# Patient Record
Sex: Female | Born: 1965
Health system: Southern US, Community
[De-identification: ages and names within clinical notes are randomized; demographics above are authoritative.]

## PROBLEM LIST (undated history)

## (undated) DIAGNOSIS — F419 Anxiety disorder, unspecified: Secondary | ICD-10-CM

## (undated) DIAGNOSIS — M26629 Arthralgia of temporomandibular joint, unspecified side: Secondary | ICD-10-CM

## (undated) DIAGNOSIS — N189 Chronic kidney disease, unspecified: Secondary | ICD-10-CM

## (undated) DIAGNOSIS — M797 Fibromyalgia: Secondary | ICD-10-CM

## (undated) DIAGNOSIS — M3500 Sicca syndrome, unspecified: Secondary | ICD-10-CM

## (undated) DIAGNOSIS — R29898 Other symptoms and signs involving the musculoskeletal system: Secondary | ICD-10-CM

## (undated) DIAGNOSIS — D759 Disease of blood and blood-forming organs, unspecified: Secondary | ICD-10-CM

## (undated) HISTORY — PX: TONSILLECTOMY: SUR1361

## (undated) HISTORY — PX: APPENDECTOMY: SHX54

## (undated) HISTORY — PX: BREAST SURGERY: SHX581

---
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
## 1998-08-10 ENCOUNTER — Encounter: Admission: RE | Admit: 1998-08-10 | Discharge: 1998-09-16 | Payer: Self-pay | Admitting: Family Medicine

## 1998-09-03 ENCOUNTER — Other Ambulatory Visit: Admission: RE | Admit: 1998-09-03 | Discharge: 1998-09-03 | Payer: Self-pay | Admitting: Family Medicine

## 2000-01-28 ENCOUNTER — Other Ambulatory Visit: Admission: RE | Admit: 2000-01-28 | Discharge: 2000-01-28 | Payer: Self-pay | Admitting: Family Medicine

## 2000-04-25 ENCOUNTER — Encounter: Payer: Self-pay | Admitting: Internal Medicine

## 2000-04-25 ENCOUNTER — Emergency Department (HOSPITAL_COMMUNITY): Admission: EM | Admit: 2000-04-25 | Discharge: 2000-04-25 | Payer: Self-pay | Admitting: Internal Medicine

## 2000-09-01 ENCOUNTER — Emergency Department (HOSPITAL_COMMUNITY): Admission: EM | Admit: 2000-09-01 | Discharge: 2000-09-01 | Payer: Self-pay | Admitting: Emergency Medicine

## 2000-09-01 ENCOUNTER — Encounter: Payer: Self-pay | Admitting: Emergency Medicine

## 2007-10-03 ENCOUNTER — Ambulatory Visit (HOSPITAL_BASED_OUTPATIENT_CLINIC_OR_DEPARTMENT_OTHER): Admission: RE | Admit: 2007-10-03 | Discharge: 2007-10-03 | Payer: Self-pay | Admitting: Family Medicine

## 2008-10-14 ENCOUNTER — Ambulatory Visit: Payer: Self-pay | Admitting: Internal Medicine

## 2008-10-14 ENCOUNTER — Inpatient Hospital Stay (HOSPITAL_COMMUNITY): Admission: EM | Admit: 2008-10-14 | Discharge: 2008-10-18 | Payer: Self-pay | Admitting: Emergency Medicine

## 2008-10-23 ENCOUNTER — Ambulatory Visit (HOSPITAL_BASED_OUTPATIENT_CLINIC_OR_DEPARTMENT_OTHER): Admission: RE | Admit: 2008-10-23 | Discharge: 2008-10-23 | Payer: Self-pay | Admitting: Family Medicine

## 2008-10-23 ENCOUNTER — Ambulatory Visit: Payer: Self-pay | Admitting: Diagnostic Radiology

## 2009-03-05 ENCOUNTER — Ambulatory Visit: Payer: Self-pay | Admitting: Diagnostic Radiology

## 2009-03-05 ENCOUNTER — Ambulatory Visit (HOSPITAL_BASED_OUTPATIENT_CLINIC_OR_DEPARTMENT_OTHER): Admission: RE | Admit: 2009-03-05 | Discharge: 2009-03-05 | Payer: Self-pay | Admitting: Family Medicine

## 2010-05-09 ENCOUNTER — Encounter: Payer: Self-pay | Admitting: Family Medicine

## 2010-07-25 LAB — CBC
HCT: 29.7 % — ABNORMAL LOW (ref 36.0–46.0)
HCT: 31.2 % — ABNORMAL LOW (ref 36.0–46.0)
HCT: 31.9 % — ABNORMAL LOW (ref 36.0–46.0)
Hemoglobin: 10.1 g/dL — ABNORMAL LOW (ref 12.0–15.0)
Hemoglobin: 10.4 g/dL — ABNORMAL LOW (ref 12.0–15.0)
Hemoglobin: 10.6 g/dL — ABNORMAL LOW (ref 12.0–15.0)
MCHC: 33.3 g/dL (ref 30.0–36.0)
MCHC: 33.4 g/dL (ref 30.0–36.0)
MCHC: 34.1 g/dL (ref 30.0–36.0)
MCV: 93.1 fL (ref 78.0–100.0)
MCV: 95.1 fL (ref 78.0–100.0)
MCV: 96 fL (ref 78.0–100.0)
Platelets: 243 10*3/uL (ref 150–400)
Platelets: 261 10*3/uL (ref 150–400)
Platelets: 283 10*3/uL (ref 150–400)
RBC: 3.19 MIL/uL — ABNORMAL LOW (ref 3.87–5.11)
RBC: 3.28 MIL/uL — ABNORMAL LOW (ref 3.87–5.11)
RBC: 3.32 MIL/uL — ABNORMAL LOW (ref 3.87–5.11)
RDW: 13.4 % (ref 11.5–15.5)
RDW: 13.6 % (ref 11.5–15.5)
RDW: 13.6 % (ref 11.5–15.5)
WBC: 12.9 10*3/uL — ABNORMAL HIGH (ref 4.0–10.5)
WBC: 16 10*3/uL — ABNORMAL HIGH (ref 4.0–10.5)
WBC: 31.2 10*3/uL — ABNORMAL HIGH (ref 4.0–10.5)

## 2010-07-25 LAB — BASIC METABOLIC PANEL
BUN: 11 mg/dL (ref 6–23)
BUN: 12 mg/dL (ref 6–23)
BUN: 8 mg/dL (ref 6–23)
CO2: 20 mEq/L (ref 19–32)
CO2: 21 mEq/L (ref 19–32)
CO2: 21 mEq/L (ref 19–32)
Calcium: 7.9 mg/dL — ABNORMAL LOW (ref 8.4–10.5)
Calcium: 8.1 mg/dL — ABNORMAL LOW (ref 8.4–10.5)
Calcium: 8.2 mg/dL — ABNORMAL LOW (ref 8.4–10.5)
Chloride: 109 mEq/L (ref 96–112)
Chloride: 112 mEq/L (ref 96–112)
Chloride: 113 mEq/L — ABNORMAL HIGH (ref 96–112)
Creatinine, Ser: 1.42 mg/dL — ABNORMAL HIGH (ref 0.4–1.2)
Creatinine, Ser: 1.53 mg/dL — ABNORMAL HIGH (ref 0.4–1.2)
Creatinine, Ser: 1.53 mg/dL — ABNORMAL HIGH (ref 0.4–1.2)
GFR calc Af Amer: 45 mL/min — ABNORMAL LOW (ref >60)
GFR calc Af Amer: 45 mL/min — ABNORMAL LOW (ref >60)
GFR calc Af Amer: 49 mL/min — ABNORMAL LOW (ref >60)
GFR calc non Af Amer: 37 mL/min — ABNORMAL LOW (ref >60)
GFR calc non Af Amer: 37 mL/min — ABNORMAL LOW (ref >60)
GFR calc non Af Amer: 40 mL/min — ABNORMAL LOW (ref >60)
Glucose, Bld: 121 mg/dL — ABNORMAL HIGH (ref 70–99)
Glucose, Bld: 132 mg/dL — ABNORMAL HIGH (ref 70–99)
Glucose, Bld: 92 mg/dL (ref 70–99)
Potassium: 3 mEq/L — ABNORMAL LOW (ref 3.5–5.1)
Potassium: 3.8 mEq/L (ref 3.5–5.1)
Potassium: 3.9 mEq/L (ref 3.5–5.1)
Sodium: 137 mEq/L (ref 135–145)
Sodium: 139 mEq/L (ref 135–145)
Sodium: 139 mEq/L (ref 135–145)

## 2010-07-26 LAB — CBC
HCT: 32.9 % — ABNORMAL LOW (ref 36.0–46.0)
HCT: 38.4 % (ref 36.0–46.0)
Hemoglobin: 10.9 g/dL — ABNORMAL LOW (ref 12.0–15.0)
Hemoglobin: 12.9 g/dL (ref 12.0–15.0)
MCHC: 33.2 g/dL (ref 30.0–36.0)
MCHC: 33.6 g/dL (ref 30.0–36.0)
MCV: 94.5 fL (ref 78.0–100.0)
MCV: 95.1 fL (ref 78.0–100.0)
Platelets: 262 10*3/uL (ref 150–400)
Platelets: 293 10*3/uL (ref 150–400)
RBC: 3.46 MIL/uL — ABNORMAL LOW (ref 3.87–5.11)
RBC: 4.07 MIL/uL (ref 3.87–5.11)
RDW: 13.6 % (ref 11.5–15.5)
RDW: 13.8 % (ref 11.5–15.5)
WBC: 26.8 10*3/uL — ABNORMAL HIGH (ref 4.0–10.5)
WBC: 36.5 10*3/uL — ABNORMAL HIGH (ref 4.0–10.5)

## 2010-07-26 LAB — BASIC METABOLIC PANEL
BUN: 13 mg/dL (ref 6–23)
BUN: 15 mg/dL (ref 6–23)
CO2: 23 mEq/L (ref 19–32)
CO2: 23 mEq/L (ref 19–32)
Calcium: 7.9 mg/dL — ABNORMAL LOW (ref 8.4–10.5)
Calcium: 8.9 mg/dL (ref 8.4–10.5)
Chloride: 107 mEq/L (ref 96–112)
Chloride: 111 mEq/L (ref 96–112)
Creatinine, Ser: 1.64 mg/dL — ABNORMAL HIGH (ref 0.4–1.2)
Creatinine, Ser: 1.7 mg/dL — ABNORMAL HIGH (ref 0.4–1.2)
GFR calc Af Amer: 40 mL/min — ABNORMAL LOW (ref >60)
GFR calc Af Amer: 41 mL/min — ABNORMAL LOW (ref >60)
GFR calc non Af Amer: 33 mL/min — ABNORMAL LOW (ref >60)
GFR calc non Af Amer: 34 mL/min — ABNORMAL LOW (ref >60)
Glucose, Bld: 101 mg/dL — ABNORMAL HIGH (ref 70–99)
Glucose, Bld: 125 mg/dL — ABNORMAL HIGH (ref 70–99)
Potassium: 3.7 mEq/L (ref 3.5–5.1)
Potassium: 4 mEq/L (ref 3.5–5.1)
Sodium: 136 mEq/L (ref 135–145)
Sodium: 138 mEq/L (ref 135–145)

## 2010-07-26 LAB — POCT PREGNANCY, URINE: Preg Test, Ur: NEGATIVE

## 2010-07-26 LAB — URINALYSIS, ROUTINE W REFLEX MICROSCOPIC
Bilirubin Urine: NEGATIVE
Glucose, UA: NEGATIVE mg/dL
Ketones, ur: NEGATIVE mg/dL
Nitrite: NEGATIVE
Protein, ur: 100 mg/dL — AB
Specific Gravity, Urine: 1.013 (ref 1.005–1.030)
Urobilinogen, UA: 0.2 mg/dL (ref 0.0–1.0)
pH: 7.5 (ref 5.0–8.0)

## 2010-07-26 LAB — DIFFERENTIAL
Basophils Absolute: 0 10*3/uL (ref 0.0–0.1)
Basophils Relative: 0 % (ref 0–1)
Eosinophils Absolute: 0.1 10*3/uL (ref 0.0–0.7)
Eosinophils Relative: 1 % (ref 0–5)
Lymphocytes Relative: 4 % — ABNORMAL LOW (ref 12–46)
Lymphs Abs: 1.2 10*3/uL (ref 0.7–4.0)
Monocytes Absolute: 2.9 10*3/uL — ABNORMAL HIGH (ref 0.1–1.0)
Monocytes Relative: 11 % (ref 3–12)
Neutro Abs: 22.6 10*3/uL — ABNORMAL HIGH (ref 1.7–7.7)
Neutrophils Relative %: 84 % — ABNORMAL HIGH (ref 43–77)

## 2010-07-26 LAB — URINE MICROSCOPIC-ADD ON

## 2010-07-26 LAB — URINE CULTURE: Colony Count: >=100000

## 2010-08-31 NOTE — Discharge Summary (Signed)
Katherine Contreras, Katherine Contreras                 ACCOUNT NO.:  [id number]   MEDICAL RECORD NO.:  [id number]          PATIENT TYPE:  INP   LOCATION:  [id number]                         FACILITY:  [facility]   PHYSICIAN:  Sheila Oats, M.D.DATE OF BIRTH:  05-19-1965   DATE OF ADMISSION:  10/14/2008  DATE OF DISCHARGE:  10/18/2008                               DISCHARGE SUMMARY   DISCHARGE DIAGNOSES:  1. Acute pyelonephritis, Escherichia coli.  2. Acute renal failure - improved with hydration, last creatinine on      discharge 1.42.  3. Hypokalemia.  4. Constipation - resolved.  5. History of idiopathic thrombocytopenic purpura and status post a      splenectomy in the past.  6. History of spinal meningitis at the age of 23.  59. History of kidney stones, with known right renal calculus.  8. History of fibromyalgia.  9. History of depression.   PROCEDURES AND STUDIES:  Renal ultrasound on October 15, 2008 - right renal  calculus, no hydronephrosis.   BRIEF HISTORY:  The patient is a 45 year old white female with the above-  listed medical problems, who presented with complaints of left flank  pain x4 days as well as left lower quadrant pain.  She reported that she  felt like she was constipated and treated herself with fiber drinks.  She subsequently had a bowel movement.  On the day of admission she  began having the flank pain and reported that it felt like when she had  a kidney stone previously.  She denied dysuria, also denied hematuria.  Admitted to urinary frequency in the 24 hours prior to admission.  She  was admitted for further evaluation and management.   Please see the full admission history and physical dictated on October 14, 2008, by Dr. Asa Lente for the details of the admission, physical exam,  as well as the laboratory data.   HOSPITAL COURSE:  1. Acute pyelonephritis - Upon admission the patient had a urinalysis      done, which showed wbc's too numerous to count and many bacteria   and also protein.  Her white cell count was elevated at 26.8.      Urine cultures were done and the patient was started empirically on      double-coverage antibiotics for pyelonephritis.  Her white cell      count initially went up to a maximum of 31.2 while in the hospital      and she developed some mild hypotension.  She was bolused with IV      fluids and maintained on the antibiotics.  Her hypotension resolved      and gradually her leukocytosis began improving.  Her left flank      pain has also improved gradually.  She has remained afebrile,      clinically improved.  Her last white cell count today prior to      discharge is 12.9 and she is tolerating p.o. well with no nausea or      vomiting.  She is also hemodynamically stable.  She will be  discharged on oral antibiotics to complete a 2-week course of      antibiotics for pyelonephritis.  She did have a renal ultrasound      done while in the hospital to evaluate for a possible abscess with      her white cell count increasing and the results as stated above,      renal calculus but otherwise normal.  2. Renal insufficiency - Her creatinine reached a peak of 1.7 in the      hospital.  With hydration this has improved, her last creatinine      today prior to discharge is 1.42.  The impression was that this was      secondary to #1 as well as the hypotension discussed above.  3. Constipation - The patient was receiving narcotic pain medications      in the hospital and is also noted to be on Vicodin at home p.r.n.      The impression was that this was the cause of her constipation.      With laxatives/enemas, this has resolved.  She is to continue the      MiraLax and stool softeners that she keeps at home.  4. History of fibromyalgia - She is to continue her outpatient      medications on discharge.  5. History of kidney stones/right renal calculus - As discussed above,      renal ultrasound revealed this finding and the  patient stated that      she was aware of this.  It is nonobstructive.  She is to follow up      with her primary care physician.  6. History of ITP, status post splenectomy - Her platelets remained      stable during her hospital stay, her last platelet count prior to      discharge is 283.  She is to follow up with her primary care      physician.  7. Hypokalemia - Her potassium was replaced in the hospital, and she      will be discharged on KCl x2 doses and is to follow up with her      primary care physician.   DISCHARGE MEDICATIONS:  1. KCl 20 mEq x2 doses.  2. Cipro 500 mg p.o. b.i.d. x10 more days.   Patient to continue:  1. Savella 200 mg daily.  2. Xanax 0.25 mg p.o. p.r.n. as previously.  3. Flexeril 10 mg q.8 h. p.r.n.  4. Vicodin q.4-6 h. p.r.n.  5. Ambien 10 mg q.h.s. p.r.n. as previously.   FOLLOWUP CARE:  Dr. Georga Bora in 1-2 weeks.   DISCHARGE CONDITION:  Improved/stable.      Sheila Oats, M.D.  Electronically Signed     ACV/MEDQ  D:  10/18/2008  T:  10/18/2008  Job:  [id number]   cc:   Herbie Baltimore L. Maceo Pro, M.D.  Fax: [phone]

## 2010-08-31 NOTE — H&P (Signed)
NAMEJOLAN, SUNDT                 ACCOUNT NO.:  [id number]   MEDICAL RECORD NO.:  [id number]          PATIENT TYPE:  EMS   LOCATION:  ED                           FACILITY[facility]  [facility]   PHYSICIAN:  Jannifer Rodney. Asa Lente, MDDATE OF BIRTH:  1966/04/03   DATE OF ADMISSION:  10/14/2008  DATE OF DISCHARGE:                              HISTORY & PHYSICAL   PRIMARY CARE PHYSICIAN:  Briscoe Deutscher, M.D. [facility])   CHIEF COMPLAINT:  Left flank pain.   HISTORY OF PRESENT ILLNESS:  The patient is a 45 year old white female  with history of kidney stones who presents to the [facility] emergency room today due to 4 days of left flank pain and left  lower quadrant pain.  Onset was Friday afternoon.  She says the pain  felt like constipation, so she treated herself with fiber drinks.  Positive bowel movement on Sunday which was normal and no bowel movement  since that time but had continued left lower quadrant and left flank  pain despite positive bowel movement.  Then she considered that the  symptoms felt today like prior kidney stone pain, so she came to the  emergency room for further evaluation.  She denies dysuria.  Denies  hematuria.  No fever, no chills.  No nausea or vomiting.  Positive  urinary frequency last 24 hours.   PAST MEDICAL HISTORY:  1. Depression.  2. Fibromyalgia.  3. Kidney stones, remote.  4. ITP history, status post splenectomy, remote.  5. Spinal meningitis at age of 61.  72. Status post appendectomy.  7. Status post tonsillectomy.   MEDICATIONS:  1. Savella 200 daily.  2. Xanax 0.25 mg q.4 h p.r.n. anxiety.  3. Flexeril 10 mg q.8 h p.r.n. spasm.  4. Vicodin p.r.n. question dose and frequency.  5. Ambien 10 mg q.h.s. p.r.n. insomnia.   ALLERGIES:  Include aspirin which she avoids due to her history of ITP  and no spleen.   FAMILY HISTORY:  Reviewed, noncontributory.   SOCIAL HISTORY:  She does not smoke.  Occasional alcohol.   REVIEW OF  SYSTEMS:  Other systems other systems are reviewed and are  negative. See HPI above.   PHYSICAL EXAMINATION:  VITAL SIGNS:  Temperature 98.2, blood pressure  125/86, pulse 100, respiratory rate 16, sating 100% on room air.  GENERAL:  She is a well-nourished, well-developed, age appropriate  appearing woman who is in acute distress, mildly uncomfortable due to  left lower quadrant pain but talking on cell without complications.  HEENT:  EYES:  Show no icterus or injection.  ENT: Oropharynx clear  without lesions or exudates.  NECK:  Supple.  No mass.  RESPIRATORY:  Shows clear to auscultation breath sounds bilaterally  without wheeze or crackle.  No increased work of breathing.  CARDIOVASCULAR:  Regular rate and rhythm.  No murmur.  EXTREMITIES:  No lower extremity edema bilaterally.  ABDOMEN:  Soft, nontender with good bowel sounds.  No rebound or  guarding.  No hepatomegaly or mass appreciated.  SKIN:  Shows no rash, bruising or petechia. Tattoo at the  small of her  back without inflammation.  MUSCULOSKELETAL:  Shows no joint deformities, tender in the left flank  CVA region to palpation.  NEURO:  Cranial nerves II-XII are symmetrically intact.  She follows  commands.  She moves all extremities and she has no dysarthria or  aphasia.  PSYCHIATRIC:  She is awake, alert and oriented x3 with a normal mood and  affect.   LABORATORY DATA:  Urinalysis with too numerous to count white blood  cells and many bacteria.  Also with 100 protein. Urine pregnancy test  negative.  CBC remarkable for white count of 26.8, normal hemoglobin and  platelets.  Basic metabolic remarkable for a creatinine of 1.6.   ASSESSMENT/PLAN:  1. Acute pyelonephritis.  This is evidenced by symptoms, urinalysis      and leukocytosis.  Also with mild renal insufficiency (elevated      creatinine), which may be new. We will continue two antibiotic      therapy with Cipro and Rocephin pending culture results.  Note       urine culture has been in ED but no blood cultures obtained and      will not draw now due to already have been given each antibiotics.      Will also check renal ultrasound in the morning to rule out      obstruction or abscess due to history of stones and plan to recheck      CBC in the a.m. Continue symptomatic treatment with p.r.n. pain      relief, IV Toradol and Dilaudid as well as Zofran as needed.  2. Mild acute renal insufficiency.  See problem #1 above.  Treat with      IV fluid and avoid renal toxic agents. Recheck B-met in a.m.  3. Fibromyalgia.  Continue home treatment.  4. History of ITP status post splenectomy. Normal platelets today.      Avoid aspirin.  Please see orders for further details.      Valerie A. Asa Lente, MD  Electronically Signed     VAL/MEDQ  D:  10/14/2008  T:  10/14/2008  Job:  [id number]

## 2011-12-28 ENCOUNTER — Other Ambulatory Visit: Payer: Self-pay | Admitting: Family Medicine

## 2011-12-28 DIAGNOSIS — R1310 Dysphagia, unspecified: Secondary | ICD-10-CM

## 2012-01-04 ENCOUNTER — Ambulatory Visit
Admission: RE | Admit: 2012-01-04 | Discharge: 2012-01-04 | Disposition: A | Discharge status: Home / Self Care | Payer: BC Managed Care – PPO | Source: Ambulatory Visit | Attending: Family Medicine | Admitting: Family Medicine

## 2012-01-04 DIAGNOSIS — R1310 Dysphagia, unspecified: Secondary | ICD-10-CM

## 2012-03-16 ENCOUNTER — Encounter (HOSPITAL_COMMUNITY): Payer: Self-pay | Admitting: *Deleted

## 2012-03-17 ENCOUNTER — Encounter (HOSPITAL_COMMUNITY): Payer: Self-pay | Admitting: Pharmacist

## 2012-03-19 ENCOUNTER — Other Ambulatory Visit: Payer: Self-pay | Admitting: Obstetrics and Gynecology

## 2012-03-22 ENCOUNTER — Encounter (HOSPITAL_COMMUNITY)
Admission: RE | Admit: 2012-03-22 | Discharge: 2012-03-22 | Disposition: A | Discharge status: Home / Self Care | Payer: BC Managed Care – PPO | Source: Ambulatory Visit | Attending: Obstetrics and Gynecology | Admitting: Obstetrics and Gynecology

## 2012-03-22 ENCOUNTER — Encounter (HOSPITAL_COMMUNITY): Payer: Self-pay

## 2012-03-22 HISTORY — DX: Disease of blood and blood-forming organs, unspecified: D75.9

## 2012-03-22 HISTORY — DX: Arthralgia of temporomandibular joint, unspecified side: M26.629

## 2012-03-22 HISTORY — DX: Sjogren syndrome, unspecified: M35.00

## 2012-03-22 LAB — CBC
HCT: 41.1 % (ref 36.0–46.0)
Hemoglobin: 13.6 g/dL (ref 12.0–15.0)
MCH: 30.8 pg (ref 26.0–34.0)
MCHC: 33.1 g/dL (ref 30.0–36.0)
MCV: 93.2 fL (ref 78.0–100.0)
Platelets: 344 10*3/uL (ref 150–400)
RBC: 4.41 MIL/uL (ref 3.87–5.11)
RDW: 13.9 % (ref 11.5–15.5)
WBC: 11.4 10*3/uL — ABNORMAL HIGH (ref 4.0–10.5)

## 2012-03-22 NOTE — Patient Instructions (Addendum)
Your procedure is scheduled on:03/28/12  Enter through the Main Entrance at :0830 am Pick up desk phone and dial [phone] and inform us of your arrival.  Please call [phone] if you have any problems the morning of surgery.  Remember: Do not eat or drink after midnight:Tuesday    DO NOT wear jewelry, eye make-up, lipstick,body lotion, or dark fingernail polish. Do not shave for 48 hours prior to surgery  Patients discharged on the day of surgery will not be allowed to drive home.

## 2012-03-28 ENCOUNTER — Encounter (HOSPITAL_COMMUNITY): Payer: Self-pay | Admitting: Anesthesiology

## 2012-03-28 ENCOUNTER — Encounter (HOSPITAL_COMMUNITY)
Admission: RE | Disposition: A | Discharge status: Home / Self Care | Payer: Self-pay | Source: Ambulatory Visit | Attending: Obstetrics and Gynecology

## 2012-03-28 ENCOUNTER — Ambulatory Visit (HOSPITAL_COMMUNITY): Payer: BC Managed Care – PPO | Admitting: Anesthesiology

## 2012-03-28 ENCOUNTER — Ambulatory Visit (HOSPITAL_COMMUNITY)
Admission: RE | Admit: 2012-03-28 | Discharge: 2012-03-28 | Disposition: A | Discharge status: Home / Self Care | Payer: BC Managed Care – PPO | Source: Ambulatory Visit | Attending: Obstetrics and Gynecology | Admitting: Obstetrics and Gynecology

## 2012-03-28 DIAGNOSIS — N840 Polyp of corpus uteri: Secondary | ICD-10-CM | POA: Insufficient documentation

## 2012-03-28 DIAGNOSIS — N938 Other specified abnormal uterine and vaginal bleeding: Secondary | ICD-10-CM | POA: Insufficient documentation

## 2012-03-28 DIAGNOSIS — N925 Other specified irregular menstruation: Secondary | ICD-10-CM | POA: Insufficient documentation

## 2012-03-28 DIAGNOSIS — Z01812 Encounter for preprocedural laboratory examination: Secondary | ICD-10-CM | POA: Insufficient documentation

## 2012-03-28 DIAGNOSIS — Z01818 Encounter for other preprocedural examination: Secondary | ICD-10-CM | POA: Insufficient documentation

## 2012-03-28 DIAGNOSIS — Z9889 Other specified postprocedural states: Secondary | ICD-10-CM

## 2012-03-28 DIAGNOSIS — N949 Unspecified condition associated with female genital organs and menstrual cycle: Secondary | ICD-10-CM | POA: Insufficient documentation

## 2012-03-28 HISTORY — PX: HYSTEROSCOPY WITH D & C: SHX1775

## 2012-03-28 HISTORY — DX: Fibromyalgia: M79.7

## 2012-03-28 HISTORY — DX: Anxiety disorder, unspecified: F41.9

## 2012-03-28 HISTORY — DX: Other symptoms and signs involving the musculoskeletal system: R29.898

## 2012-03-28 HISTORY — DX: Chronic kidney disease, unspecified: N18.9

## 2012-03-28 LAB — PREGNANCY, URINE: Preg Test, Ur: NEGATIVE

## 2012-03-28 SURGERY — DILATATION AND CURETTAGE /HYSTEROSCOPY
Anesthesia: General | Site: Vagina | Wound class: Clean Contaminated

## 2012-03-28 MED ORDER — LACTATED RINGERS IV SOLN
INTRAVENOUS | Status: DC
  Administered 2012-03-28 (×3): via INTRAVENOUS

## 2012-03-28 MED ORDER — LIDOCAINE HCL 2 % IJ SOLN
INTRAMUSCULAR | Status: AC
  Filled 2012-03-28: qty 20

## 2012-03-28 MED ORDER — PROMETHAZINE HCL 25 MG/ML IJ SOLN: 6.2500 mg | INTRAMUSCULAR | Status: DC | PRN

## 2012-03-28 MED ORDER — DEXAMETHASONE SODIUM PHOSPHATE 10 MG/ML IJ SOLN
INTRAMUSCULAR | Status: AC
  Filled 2012-03-28: qty 1

## 2012-03-28 MED ORDER — NAPROXEN 250 MG PO TABS: 250.0000 mg | ORAL_TABLET | Freq: Two times a day (BID) | ORAL | Status: DC

## 2012-03-28 MED ORDER — MIDAZOLAM HCL 2 MG/2ML IJ SOLN: 0.5000 mg | Freq: Once | INTRAMUSCULAR | Status: DC | PRN

## 2012-03-28 MED ORDER — LIDOCAINE HCL (CARDIAC) 20 MG/ML IV SOLN
INTRAVENOUS | Status: DC | PRN
  Administered 2012-03-28: 60 mg via INTRAVENOUS

## 2012-03-28 MED ORDER — FENTANYL CITRATE 0.05 MG/ML IJ SOLN
INTRAMUSCULAR | Status: DC | PRN
  Administered 2012-03-28 (×4): 50 ug via INTRAVENOUS

## 2012-03-28 MED ORDER — ONDANSETRON HCL 4 MG/2ML IJ SOLN
INTRAMUSCULAR | Status: DC | PRN
  Administered 2012-03-28: 4 mg via INTRAVENOUS

## 2012-03-28 MED ORDER — PROPOFOL 10 MG/ML IV EMUL
INTRAVENOUS | Status: DC | PRN
  Administered 2012-03-28: 150 mg via INTRAVENOUS

## 2012-03-28 MED ORDER — FENTANYL CITRATE 0.05 MG/ML IJ SOLN
INTRAMUSCULAR | Status: AC
  Administered 2012-03-28: 50 ug via INTRAVENOUS
  Filled 2012-03-28: qty 2

## 2012-03-28 MED ORDER — LIDOCAINE HCL (CARDIAC) 20 MG/ML IV SOLN
INTRAVENOUS | Status: AC
  Filled 2012-03-28: qty 5

## 2012-03-28 MED ORDER — ONDANSETRON HCL 4 MG/2ML IJ SOLN
INTRAMUSCULAR | Status: AC
  Filled 2012-03-28: qty 2

## 2012-03-28 MED ORDER — GLYCINE 1.5 % IR SOLN
Status: DC | PRN
  Administered 2012-03-28: 3000 mL

## 2012-03-28 MED ORDER — MIDAZOLAM HCL 2 MG/2ML IJ SOLN
INTRAMUSCULAR | Status: AC
  Filled 2012-03-28: qty 2

## 2012-03-28 MED ORDER — MEPERIDINE HCL 25 MG/ML IJ SOLN: 6.2500 mg | INTRAMUSCULAR | Status: DC | PRN

## 2012-03-28 MED ORDER — CEFAZOLIN SODIUM-DEXTROSE 2-3 GM-% IV SOLR
2.0000 g | INTRAVENOUS | Status: AC
  Administered 2012-03-28: 2 g via INTRAVENOUS

## 2012-03-28 MED ORDER — FENTANYL CITRATE 0.05 MG/ML IJ SOLN
25.0000 ug | INTRAMUSCULAR | Status: DC | PRN
  Administered 2012-03-28: 50 ug via INTRAVENOUS

## 2012-03-28 MED ORDER — FENTANYL CITRATE 0.05 MG/ML IJ SOLN
INTRAMUSCULAR | Status: AC
  Filled 2012-03-28: qty 4

## 2012-03-28 MED ORDER — LIDOCAINE HCL 2 % IJ SOLN
INTRAMUSCULAR | Status: DC | PRN
  Administered 2012-03-28: 20 mL

## 2012-03-28 MED ORDER — MIDAZOLAM HCL 5 MG/5ML IJ SOLN
INTRAMUSCULAR | Status: DC | PRN
  Administered 2012-03-28: 2 mg via INTRAVENOUS

## 2012-03-28 MED ORDER — PROPOFOL 10 MG/ML IV EMUL
INTRAVENOUS | Status: AC
  Filled 2012-03-28: qty 20

## 2012-03-28 MED ORDER — DEXAMETHASONE SODIUM PHOSPHATE 4 MG/ML IJ SOLN
INTRAMUSCULAR | Status: DC | PRN
  Administered 2012-03-28: 4 mg via INTRAVENOUS

## 2012-03-28 SURGICAL SUPPLY — 14 items
CANISTER SUCTION 2500CC (MISCELLANEOUS) ×2 IMPLANT
CATH ROBINSON RED A/P 16FR (CATHETERS) ×2 IMPLANT
CLOTH BEACON ORANGE TIMEOUT ST (SAFETY) ×2 IMPLANT
CONTAINER PREFILL 10% NBF 60ML (FORM) ×4 IMPLANT
DILATOR CANAL MILEX (MISCELLANEOUS) ×1 IMPLANT
DRESSING TELFA 8X3 (GAUZE/BANDAGES/DRESSINGS) ×2 IMPLANT
GLOVE BIO SURGEON STRL SZ7 (GLOVE) ×2 IMPLANT
GLOVE BIOGEL PI IND STRL 7.0 (GLOVE) ×2 IMPLANT
GLOVE BIOGEL PI INDICATOR 7.0 (GLOVE) ×2
GOWN STRL REIN XL XLG (GOWN DISPOSABLE) ×4 IMPLANT
PACK HYSTEROSCOPY LF (CUSTOM PROCEDURE TRAY) ×2 IMPLANT
PAD OB MATERNITY 4.3X12.25 (PERSONAL CARE ITEMS) ×2 IMPLANT
TOWEL OR 17X24 6PK STRL BLUE (TOWEL DISPOSABLE) ×4 IMPLANT
WATER STERILE IRR 1000ML POUR (IV SOLUTION) ×2 IMPLANT

## 2012-03-28 NOTE — Anesthesia Postprocedure Evaluation (Signed)
Anesthesia Post Note  Patient: Katherine Contreras  Procedure(s) Performed: Procedure(s) (LRB): DILATATION AND CURETTAGE /HYSTEROSCOPY (N/A)  Anesthesia type: GA  Patient location: PACU  Post pain: Pain level controlled  Post assessment: Post-op Vital signs reviewed  Last Vitals:  Filed Vitals:   03/28/12 1107  BP:   Pulse: 100  Temp:   Resp: 16    Post vital signs: Reviewed  Level of consciousness: sedated  Complications: No apparent anesthesia complications

## 2012-03-28 NOTE — H&P (Signed)
02/28/2012  History of Present Illness General:  46 y/o G1P1 referred for abnormal vaginal bleeding. She was noted to have a focal endometrial abnormality on ultrasound and a sonohystogram was previously ordered. Pt has a h/o regular menses x 28 days. Spotting for 30 days then had a period. Misses a month and then spots. New pattern started since the beginning of the year. Savella was prescribed ~ 1 year ago. Pt's normal menses were every 28 days, last 5 days. Three of those days were heavy, 4 pads/tampons used on heaviest days. Having hot flashes, night sweats and vaginal dryness. No pain with sex.  Went to infertility doctor 12 years ago and told her eggs were old. Given Provera and bled through it and stopped and spotting again. Did not seem to slow it down.  Current Medications  Amitiza 24 MCG Capsule 1 capsule with food Twice a day  Fluticasone Propionate 50 MCG/ACT Suspension 1 spray in each nostril once a day  Restasis 0.05 % Emulsion 1 into affected eye Twice daily  Savella 100 MG Tablet 1 tablet Twice daily  Tramadol HCl 50 MG Tablet 1 tablet three times a day as needed  Evoxac 30 MG Capsule 1 capsule as needed  Promethazine HCl 25mg  tab 1 tablet As needed  Cyclobenzaprine HCl 10 MG Tablet 1 tablet three times a day as needed  Alprazolam 0.5 MG Tablet 1/2 - 1 tablet Three times daily as needed  Hydrocodone-Acetaminophen 5-500 MG Tablet 1 tablet as needed for pain every 6 hrs  Ambien 10 MG Tablet 1/2-1 Once nightly as needed  Savella 50 mg 1 tab bid  Medication List reviewed and reconciled with the patient   Past Medical History  Migraine headaches  Fibromyalgia  Anxiety/panic disorder  Sjogren syndrome  TMJ  Hx of ITP  Aslpenic  Kidney stones  Allergic rhinitis  Stage 3 chronic kidney disease (Dr. Posey Pronto)   Surgical History  Splenectomy 1985  Appendectomy 1985  Tonsillectomy 1970   Family History  Father: deceased multiple myleoma, bone cancer   Mother: alive CAD,  anxiety, depression, HTN, arthritis, elevated lipids   Paternal [place] Father: deceased pancreatic cancer   Paternal Grand Mother: deceased Alzheimers   Maternal [place] Father: deceased   Maternal Grand Mother: deceased   Brother 1: alive healthy   Brother2: alive substance abuse   Sister 1: alive diabetes, kidney stones, 3 kidneys   2 brother(s) , 1 sister(s) .   no colon cancer, liver disease or polyps in history, denies any GYN family cancer hx.   Social History  General:  History of smoking cigarettes: Never smoked.  no Smoking.  Alcohol: yes, occasionally.  Caffeine: yes, tea.  no Recreational drug use, cocaine use in the past.  no Diet.  no Exercise.  Occupation: employed, [employer] in a [employer].  Education: yes, Some College.  Marital Status: single.  Children: 1, girls.  Religion: yes, [employer].    Gyn History  Sexual activity currently sexually active.  Periods : every month usually, the past year they have been irregular.  LMP 02/21/12.  Denies H/O Birth control .  Last pap smear date 12/28/11, neg.  Denies H/O Last mammogram date .  Denies H/O Abnormal pap smear .  Denies H/O STD .    OB History  Number of pregnancies 1.  Pregnancy # 1 live birth, vaginal delivery, girl.    Allergies  Aspir-81: ITP: Side Effects   Hospitalization/Major Diagnostic Procedure  abdominal pain 09/2008   Review of Systems  CONSTITUTIONAL:  Fatigue none. Fever none today.  SKIN:  Rash no. Suspicious lesions no.  CARDIOLOGY:  Chest pain none. Murmurs No h/o mitral valve prolapse.  RESPIRATORY:  Shortness of breath no. Cough no.  GASTROENTEROLOGY:  Abdominal pain none. Change in bowel habits no. Constipation No. Gallstones No gallbladder problems. Hepatitis/yellow jaundice No h/o liver problems.  FEMALE REPRODUCTIVE:  Breast lumps or discharge no. Breast pain none. Dyspareunia none. Dysuria no. Irregular menses no . Pelvic pain none. Regular menses yes. Unusual vaginal discharge yes,  vaginal spotting. Vaginal itching no.  NEUROLOGY:  Migraines yes. Seizures No. Tingling/numbness none.  PSYCHOLOGY:  Depression no.  ENDOCRINOLOGY:  Hot flashes yes. Weight gain none. Weight loss none.  HEMATOLOGY/LYMPH:  Anemia no. Blood Clots No h/o blood clots.  DERMATOLOGY:  Acne yes.  See HPI.  Vital Signs  Wt 114, Wt change -3 lb, Ht 65, BMI 18.97, BP sitting 148/90.    Physical Examination  GENERAL:          Patient appears  alert and oriented.          General Appearance:  well-appearing, well-developed, no acute distress, thin.          Speech:  clear.   FEMALE GENITOURINARY:          Cervix  visualized, healthy appearing, no discharge, no lesions.          Adnexa:  no mass, non tender.          Uterus:  normal size/shape/consistency, freely mobile, non tender.          Vagina:  pink/moist mucosa, no lesions, no abnormal discharge.          Vulva:  normal, no lesions, no skin discoloration.          Anus:  no external hemosrhoids.   EXTREMITIES:          general  no edema.           Assessments  1. DUB (dysfunctional uterine bleeding) - 626.8 (Primary)  2. Vaginal discharge - 623.5  3. Pregnancy test negative - V72.41    Treatment  1. DUB (dysfunctional uterine bleeding)   Start Provera Tablet, 10 MG, 1 tablet, Orally, Once daily x 10 days, 10 days, 10, Refills 1      LAB: [facility]     FSH 12.1 1.8-22.5 - MlU/ML               Shaquella Stamant B 02/28/2012 08:42:00 PM > FSH elevated by not perimenopausal by lab. Wet mount c/w BV, which is likely due to prolonged bleeding. Flagyl 500 mg BID x 7 days. #14 Ref: 0. Allman,Michelle 02/29/2012 10:33:12 AM > Pt infomred and rx sent.        LAB: Prolactin [id number])     Prolactin 22.1 4.8-23.3 - NG/ML               Allman,Michelle 02/29/2012 10:33:12 AM > Pt infomred and rx sent. Antwain Caliendo B 03/06/2012 09:33:56 AM > High normal. Repeat prolactin in a few months if DUB persists. Allman,Michelle 03/06/2012 09:41:17 AM > pt  informed.        LAB: [facility] Chlamydia/Gonorrhea Amplification [id number])  Negative     Chlamydia trachomatis, NAA NEGATIVE NEGATIVE -        Neisseria gonorrhoeae, NAA NEGATIVE NEGATIVE -        Please note: COMMENT -               Ozan Maclay B 03/06/2012 09:35:17 AM >  GC/chl neg Allman,Michelle 03/06/2012 09:41:17 AM > pt informed.   Procedure:GYN ENDOMETRIAL BIOPSY    Proceed with endometrial sampling in lieu of sonohystogram. Recommend proceeding with hysteroscopy/D&C if biopsy is normal for definitive management.      2. Vaginal discharge        LAB: [facility] CELLS Present -        TRICHOMONAS None Seen -        WBCS Normal -        YEAST None seen -               Azalyn Sliwa B 02/28/2012 08:42:00 PM > FSH elevated by not perimenopausal by lab. Wet mount c/w BV, which is likely due to prolonged bleeding. Flagyl 500 mg BID x 7 days. #14 Ref: 0. Allman,Michelle 02/29/2012 10:33:12 AM > Pt infomred and rx sent.     3. Pregnancy test negative        LAB: Pregnancy Test, Urine     Pregnancy Test, Urine negative               Allman,Michelle 02/28/2012 03:29:40 PM > Dr. Clayton Bibles informed.        Procedures  Venipuncture:          Venipuncture: Smith,Michele 02/28/2012 04:01:24 PM > , performed in right arm.   Endometrial Biopsy:          Consent Informed consent obtained.          Prep cervix was prepped with betadine.          Procedure uterus was sounded to7cm, a 4 mm pipet was advanced without difficulty, with good return of tissue.          Post procedure Patient tolerated procedure well.          Indication irregular bleeding .

## 2012-03-28 NOTE — Anesthesia Preprocedure Evaluation (Signed)
Anesthesia Evaluation  Patient identified by MRN, date of birth, ID band Patient awake    Reviewed: Allergy & Precautions, H&P , Patient's Chart, lab work & pertinent test results, reviewed documented beta blocker date and time   History of Anesthesia Complications Negative for: history of anesthetic complications  Airway Mallampati: II TM Distance: >3 FB Neck ROM: full    Dental No notable dental hx.    Pulmonary neg pulmonary ROS,  breath sounds clear to auscultation  Pulmonary exam normal       Cardiovascular Exercise Tolerance: Good negative cardio ROS  Rhythm:regular Rate:Normal     Neuro/Psych  Neuromuscular disease negative neurological ROS  negative psych ROS   GI/Hepatic negative GI ROS, Neg liver ROS,   Endo/Other  negative endocrine ROS  Renal/GU Renal diseasenegative Renal ROS     Musculoskeletal   Abdominal   Peds  Hematology negative hematology ROS (+) Blood dyscrasia, ,   Anesthesia Other Findings TMJ click     Chronic kidney disease   THIRD STAGE KIDNEY DISEASE - 30%    Anxiety     Fibromyalgia        Blood dyscrasia   remission of ITP Sjogren's syndrome        TMJ syndrome    Reproductive/Obstetrics negative OB ROS                           Anesthesia Physical Anesthesia Plan  ASA: III  Anesthesia Plan: General LMA   Post-op Pain Management:    Induction:   Airway Management Planned:   Additional Equipment:   Intra-op Plan:   Post-operative Plan:   Informed Consent: I have reviewed the patients History and Physical, chart, labs and discussed the procedure including the risks, benefits and alternatives for the proposed anesthesia with the patient or authorized representative who has indicated his/her understanding and acceptance.   Dental Advisory Given  Plan Discussed with: CRNA, Surgeon and Anesthesiologist  Anesthesia Plan Comments:          Anesthesia Quick Evaluation

## 2012-03-28 NOTE — Transfer of Care (Signed)
Immediate Anesthesia Transfer of Care Note  Patient: Katherine Contreras  Procedure(s) Performed: Procedure(s) (LRB) with comments: DILATATION AND CURETTAGE /HYSTEROSCOPY (N/A)  Patient Location: PACU  Anesthesia Type:General  Level of Consciousness: awake, alert , oriented and patient cooperative  Airway & Oxygen Therapy: Patient Spontanous Breathing and Patient connected to nasal cannula oxygen  Post-op Assessment: Report given to PACU RN and Post -op Vital signs reviewed and stable  Post vital signs: Reviewed and stable  Complications: No apparent anesthesia complications

## 2012-03-28 NOTE — Interval H&P Note (Signed)
History and Physical Interval Note:  03/28/2012 9:50 AM  Katherine Contreras  has presented today for surgery, with the diagnosis of DUB  The various methods of treatment have been discussed with the patient and family. After consideration of risks, benefits and other options for treatment, the patient has consented to  Procedure(s) (LRB) with comments: DILATATION AND CURETTAGE /HYSTEROSCOPY (N/A) as a surgical intervention .  The patient's history has been reviewed, patient examined, no change in status, stable for surgery.  I have reviewed the patient's chart and labs.  Questions were answered to the patient's satisfaction.    Pt reports she had a normal period 2 weeks ago.  Doing well.  Simona Huh, Kaelob Persky

## 2012-03-28 NOTE — Preoperative (Signed)
Beta Blockers   Reason not to administer Beta Blockers:Not Applicable 

## 2012-03-28 NOTE — Brief Op Note (Signed)
03/28/2012  12:42 PM  PATIENT:  Katherine Contreras  46 y.o. female  PRE-OPERATIVE DIAGNOSIS:  DUB  POST-OPERATIVE DIAGNOSIS:  dysfunctional uterine bleeding, endometrial mass  PROCEDURE:  Procedure(s) (LRB) with comments: DILATATION AND CURETTAGE /HYSTEROSCOPY (N/A)  SURGEON:  Surgeon(s) and Role:    * Thurnell Lose, MD - Primary  PHYSICIAN ASSISTANT: None  ASSISTANTS: Technician   ANESTHESIA:   general  EBL:  Total I/O In: 1700 [I.V.:1700] Out: 220 [Urine:200; Blood:20]  BLOOD ADMINISTERED:none  DRAINS: none   LOCAL MEDICATIONS USED:  2 %XYLOCAINE, 10 cc   SPECIMEN:  Source of Specimen:  Endometrial mass, endometrial currettings  DISPOSITION OF SPECIMEN:  PATHOLOGY  COUNTS:  YES  TOURNIQUET:  * No tourniquets in log *  DICTATION: .Other Dictation: Dictation Number [id number]  PLAN OF CARE: Discharge to home after PACU  PATIENT DISPOSITION:  PACU - hemodynamically stable.   Delay start of Pharmacological VTE agent (>24hrs) due to surgical blood loss or risk of bleeding: not applicable

## 2012-03-29 ENCOUNTER — Encounter (HOSPITAL_COMMUNITY): Payer: Self-pay | Admitting: Obstetrics and Gynecology

## 2012-03-29 NOTE — Op Note (Signed)
NAME:  JONNY, SYLVE NO.:  [id number]  MEDICAL RECORD NO.:  [id number]  LOCATION:  [facility]                          FACILITY:  [facility]  PHYSICIAN:  Jola Schmidt, MD   DATE OF BIRTH:  28-May-1965  DATE OF PROCEDURE:  03/28/2012 DATE OF DISCHARGE:  03/28/2012                              OPERATIVE REPORT   PREOPERATIVE DIAGNOSIS:  Dysfunctional uterine bleeding.  POSTOPERATIVE DIAGNOSES: 1. Dysfunctional uterine bleeding. 2. Endometrial mass.  SURGEON:  Raul Del. Simona Huh, MD  ASSISTANT:  Technician.  ANESTHESIA:  General.  Local 2% Xylocaine 10 mL.  ESTIMATED BLOOD LOSS:  20 mL.  URINE OUTPUT:  200 mL.  BLOOD ADMINISTERED:  None.  DRAINS:  None.  SPECIMEN:  Endometrial mass with biopsy and endometrial curettings.  DISPOSITION OF SPECIMEN:  To Pathology.  COUNTS:  Correct.  CONDITION:  To PACU, hemodynamically stable.  COMPLICATIONS:  None.  FINDINGS:  Small endometrial cavity, ostia seen bilaterally.  Midline endometrial mass, spongy in texture.  Base is at the anterior portion of the uterus and the posterior portion of the uterus.  No excrescences. Tissue was nonfriable.  No obvious signs of malignancy.  Stenotic cervical os.  DESCRIPTION OF PROCEDURE:  The patient was identified in the holding area.  She was then taken to the operating room where she underwent LMA anesthesia without complication.  She was then placed in the dorsal lithotomy position.  A bimanual exam was performed, revealing anteverted uterus and normal ovaries palpable bilaterally.  She was then prepped and draped in a normal sterile fashion.  Graves speculum was inserted into the uterus.  The cervical os was stenotic, so an os binder was used, and she was dilated.  She had a small curvature to her endocervical canal, and it was thought I was going to need to use different dilators, but I was able to dilate up to a 5 Hegar.  The hysteroscope was then advanced to the uterine  fundus which revealed the findings above.  Resection of the polyp was partially achieved with the polyp forceps and curettage.  The lesion appeared to be fixed to the uterine wall, so it did not have a thin stalk that I could just grab it and it was not free floating, so I did a direct biopsy under hysteroscopic visualization and that was sent to the pathologist separately.  The curettage was performed and all 4 quadrants tissue was obtained.  There was significant debulking of the polyps noted.  Prior to the procedure, a paracervical block with 10 mL of 2% Xylocaine was used.  The single-tooth tenaculum was used to grasp the anterior lip of the cervix.  The tenaculum was removed from the anterior lip of the cervix and hemostasis was achieved with tamponade.  There was no active bleeding from the cervical os upon removal of all instruments.  All instruments were removed from the vagina.  The patient tolerated the procedure well.  All instrument and sponge counts were correct x3.  She was awakened in the OR, and taken to the recovery room in stable condition.  She had Ancef 2 g IV prior to the procedure.  She had SCDs  on and operating during the entire case and a time-out was taken prior to starting the procedure.     Jola Schmidt, MD     EBV/MEDQ  D:  03/28/2012  T:  03/29/2012  Job:  [id number]

## 2013-07-31 ENCOUNTER — Other Ambulatory Visit (HOSPITAL_COMMUNITY): Payer: Self-pay | Admitting: Nephrology

## 2013-07-31 DIAGNOSIS — N183 Chronic kidney disease, stage 3 unspecified: Secondary | ICD-10-CM

## 2013-08-07 ENCOUNTER — Other Ambulatory Visit: Payer: Self-pay | Admitting: Radiology

## 2013-08-09 ENCOUNTER — Other Ambulatory Visit: Payer: Self-pay | Admitting: Radiology

## 2013-08-09 ENCOUNTER — Encounter (HOSPITAL_COMMUNITY): Payer: Self-pay | Admitting: Pharmacy Technician

## 2013-08-12 ENCOUNTER — Encounter (HOSPITAL_COMMUNITY): Payer: Self-pay

## 2013-08-12 ENCOUNTER — Ambulatory Visit (HOSPITAL_COMMUNITY)
Admission: RE | Admit: 2013-08-12 | Discharge: 2013-08-12 | Disposition: A | Discharge status: Home / Self Care | Payer: BC Managed Care – PPO | Source: Ambulatory Visit | Attending: Nephrology | Admitting: Nephrology

## 2013-08-12 DIAGNOSIS — M3500 Sicca syndrome, unspecified: Secondary | ICD-10-CM | POA: Insufficient documentation

## 2013-08-12 DIAGNOSIS — N183 Chronic kidney disease, stage 3 unspecified: Secondary | ICD-10-CM | POA: Insufficient documentation

## 2013-08-12 DIAGNOSIS — IMO0001 Reserved for inherently not codable concepts without codable children: Secondary | ICD-10-CM | POA: Insufficient documentation

## 2013-08-12 LAB — CBC
HCT: 39.1 % (ref 36.0–46.0)
Hemoglobin: 13 g/dL (ref 12.0–15.0)
MCH: 30.2 pg (ref 26.0–34.0)
MCHC: 33.2 g/dL (ref 30.0–36.0)
MCV: 90.7 fL (ref 78.0–100.0)
Platelets: 334 10*3/uL (ref 150–400)
RBC: 4.31 MIL/uL (ref 3.87–5.11)
RDW: 13.3 % (ref 11.5–15.5)
WBC: 9.2 10*3/uL (ref 4.0–10.5)

## 2013-08-12 LAB — PROTIME-INR
INR: 1.07 (ref 0.00–1.49)
Prothrombin Time: 13.7 seconds (ref 11.6–15.2)

## 2013-08-12 LAB — APTT: aPTT: 31 seconds (ref 24–37)

## 2013-08-12 LAB — PREGNANCY, URINE: Preg Test, Ur: NEGATIVE

## 2013-08-12 MED ORDER — MIDAZOLAM HCL 2 MG/2ML IJ SOLN
INTRAMUSCULAR | Status: AC | PRN
  Administered 2013-08-12 (×2): 2 mg via INTRAVENOUS

## 2013-08-12 MED ORDER — FENTANYL CITRATE 0.05 MG/ML IJ SOLN
INTRAMUSCULAR | Status: AC
  Filled 2013-08-12: qty 4

## 2013-08-12 MED ORDER — SODIUM CHLORIDE 0.9 % IV SOLN: Freq: Once | INTRAVENOUS | Status: DC

## 2013-08-12 MED ORDER — MIDAZOLAM HCL 2 MG/2ML IJ SOLN
INTRAMUSCULAR | Status: AC
  Filled 2013-08-12: qty 4

## 2013-08-12 MED ORDER — FENTANYL CITRATE 0.05 MG/ML IJ SOLN
INTRAMUSCULAR | Status: AC | PRN
Start: 2013-08-12 — End: 2013-08-12
  Administered 2013-08-12 (×2): 50 ug via INTRAVENOUS

## 2013-08-12 NOTE — Procedures (Signed)
Procedure:  Ultrasound guided core biopsy of right kidney Findings:  16 G core biopsy x 2 of LP cortex of right kidney.  No immediate complications. 

## 2013-08-12 NOTE — H&P (Signed)
Katherine Contreras is an 48 y.o. female.   Chief Complaint: Pt with Chronic Kidney Disease followed by Dr Posey Pronto over last 2 yrs. Worsening renal function over last yr. Now scheduled for renal core biopsy Pt with hx Sjogrens syndrome  HPI: CKD lll; fibromyalgia; Sjogrens; TMJ; ITP hx   Past Medical History  Diagnosis Date  . TMJ click   . Chronic kidney disease     THIRD STAGE KIDNEY DISEASE - 30%  . Anxiety   . Fibromyalgia   . Blood dyscrasia     remission of ITP  . Sjogren's syndrome   . TMJ syndrome     Past Surgical History  Procedure Laterality Date  . Appendectomy    . Tonsillectomy    . Spleenectomy    . Breast surgery      AUGMENTATION  . Hysteroscopy w/d&c  03/28/2012    Procedure: DILATATION AND CURETTAGE /HYSTEROSCOPY;  Surgeon: Thurnell Lose, MD;  Location: [facility];  Service: Gynecology;  Laterality: N/A;    No family history on file. Social History:  reports that she has never smoked. She does not have any smokeless tobacco history on file. She reports that she drinks about .6 ounces of alcohol per week. She reports that she does not use illicit drugs.  Allergies:  Allergies  Allergen Reactions  . Aspirin Other (See Comments)    SPLEEN REMOVED AND HISTORY OF PLATELET PROBLEM IN THE PAST Pt takes alleve if needed     (Not in a hospital admission)  Results for orders placed during the hospital encounter of 08/12/13 (from the past 48 hour(s))  CBC     Status: None   Collection Time    08/12/13  8:53 AM      Result Value Ref Range   WBC 9.2  4.0 - 10.5 K/uL   RBC 4.31  3.87 - 5.11 MIL/uL   Hemoglobin 13.0  12.0 - 15.0 g/dL   HCT 39.1  36.0 - 46.0 %   MCV 90.7  78.0 - 100.0 fL   MCH 30.2  26.0 - 34.0 pg   MCHC 33.2  30.0 - 36.0 g/dL   RDW 13.3  11.5 - 15.5 %   Platelets 334  150 - 400 K/uL   No results found.  Review of Systems  Constitutional: Negative for fever, chills and weight loss.  Respiratory: Negative for cough and sputum production.    Cardiovascular: Negative for chest pain.  Gastrointestinal: Negative for nausea, vomiting and abdominal pain.  Genitourinary: Negative for hematuria.  Musculoskeletal: Negative for back pain.  Neurological: Negative for weakness and headaches.  Psychiatric/Behavioral: Negative for substance abuse. The patient is nervous/anxious.     Blood pressure 113/90, pulse 93, temperature 97.8 F (36.6 C), temperature source Oral, resp. rate 20, height 5\' 4"  (1.626 m), weight 54.432 kg (120 lb), SpO2 99.00%. Physical Exam  Constitutional: She is oriented to person, place, and time. She appears well-developed and well-nourished.  Cardiovascular: Normal rate and regular rhythm.   No murmur heard. Respiratory: Effort normal and breath sounds normal. She has no wheezes.  GI: Soft. Bowel sounds are normal. There is no tenderness.  Musculoskeletal: Normal range of motion.  Neurological: She is alert and oriented to person, place, and time.  Skin: Skin is warm and dry.  Psychiatric: She has a normal mood and affect. Her behavior is normal. Judgment and thought content normal.     Assessment/Plan CKDlll Declining function especially this last yr Renal biopsy requested by Dr Posey Pronto  Pt aware of procedure beneifts and risks and agreeable to proceed Consent signed and in chart   Lavonia Drafts 08/12/2013, 9:35 AM

## 2013-08-12 NOTE — Discharge Instructions (Signed)
Kidney Biopsy, Care After °Refer to this sheet in the next few weeks. These instructions provide you with information on caring for yourself after your procedure. Your health care provider may also give you more specific instructions. Your treatment has been planned according to current medical practices, but problems sometimes occur. Call your health care provider if you have any problems or questions after your procedure.  °WHAT TO EXPECT AFTER THE PROCEDURE  °· You may notice blood in the urine for the first 24 hours after the biopsy. °· You may feel some pain at the biopsy site for 1 2 weeks after the biopsy. °HOME CARE INSTRUCTIONS °· Do not lift anything heavier than 10 lb (4.5 kg) for 2 weeks. °· Do not take any non-steroidal anti-inflammatory drugs (NSAIDs) or any blood thinners for a week after the biopsy unless instructed to do so by your health care provider. °· Only take medicines for pain, fever, or discomfort as directed by your health care provider. °SEEK MEDICAL CARE IF: °· You have bloody urine more than 24 hours after the biopsy.   °· You develop a fever.   °· You cannot urinate.   °· You have increasing pain at the biopsy site.   °SEEK IMMEDIATE MEDICAL CARE IF: °You feel faint or dizzy.  °Document Released: 12/05/2012 Document Reviewed: 12/05/2012 °ExitCare® Patient Information ©2014 [employer]. ° °

## 2013-08-12 NOTE — H&P (Signed)
Agree.  For random core biopsy of kidney today.

## 2013-08-27 ENCOUNTER — Other Ambulatory Visit: Payer: Self-pay

## 2013-08-29 ENCOUNTER — Encounter (HOSPITAL_COMMUNITY): Payer: Self-pay

## 2014-10-02 ENCOUNTER — Other Ambulatory Visit: Payer: Self-pay | Admitting: Physician Assistant

## 2014-10-02 ENCOUNTER — Other Ambulatory Visit: Payer: Self-pay | Admitting: Family Medicine

## 2014-10-02 DIAGNOSIS — M5416 Radiculopathy, lumbar region: Secondary | ICD-10-CM

## 2014-10-06 ENCOUNTER — Ambulatory Visit (INDEPENDENT_AMBULATORY_CARE_PROVIDER_SITE_OTHER): Payer: BLUE CROSS/BLUE SHIELD

## 2014-10-06 DIAGNOSIS — M5416 Radiculopathy, lumbar region: Secondary | ICD-10-CM

## 2014-10-06 DIAGNOSIS — M5127 Other intervertebral disc displacement, lumbosacral region: Secondary | ICD-10-CM | POA: Diagnosis not present

## 2014-10-08 ENCOUNTER — Other Ambulatory Visit: Payer: Self-pay | Admitting: Family Medicine

## 2014-10-08 DIAGNOSIS — N2889 Other specified disorders of kidney and ureter: Secondary | ICD-10-CM

## 2014-10-09 ENCOUNTER — Ambulatory Visit (INDEPENDENT_AMBULATORY_CARE_PROVIDER_SITE_OTHER): Payer: BLUE CROSS/BLUE SHIELD

## 2014-10-09 DIAGNOSIS — N2889 Other specified disorders of kidney and ureter: Secondary | ICD-10-CM

## 2014-12-31 ENCOUNTER — Other Ambulatory Visit: Payer: Self-pay | Admitting: Specialist

## 2014-12-31 DIAGNOSIS — M5416 Radiculopathy, lumbar region: Secondary | ICD-10-CM

## 2015-01-15 ENCOUNTER — Ambulatory Visit
Admission: RE | Admit: 2015-01-15 | Discharge: 2015-01-15 | Disposition: A | Discharge status: Home / Self Care | Payer: BLUE CROSS/BLUE SHIELD | Source: Ambulatory Visit | Attending: Specialist | Admitting: Specialist

## 2015-01-15 DIAGNOSIS — M5416 Radiculopathy, lumbar region: Secondary | ICD-10-CM

## 2015-07-20 ENCOUNTER — Other Ambulatory Visit: Payer: Self-pay | Admitting: Specialist

## 2015-07-20 DIAGNOSIS — M5432 Sciatica, left side: Secondary | ICD-10-CM

## 2015-07-28 ENCOUNTER — Ambulatory Visit
Admission: RE | Admit: 2015-07-28 | Discharge: 2015-07-28 | Disposition: A | Discharge status: Home / Self Care | Payer: BLUE CROSS/BLUE SHIELD | Source: Ambulatory Visit | Attending: Specialist | Admitting: Specialist

## 2015-07-28 DIAGNOSIS — M5432 Sciatica, left side: Secondary | ICD-10-CM

## 2015-07-28 DIAGNOSIS — M7122 Synovial cyst of popliteal space [Baker], left knee: Secondary | ICD-10-CM | POA: Diagnosis not present

## 2015-07-28 MED ORDER — METHYLPREDNISOLONE ACETATE 40 MG/ML INJ SUSP (RADIOLOG: 120.0000 mg | Freq: Once | INTRAMUSCULAR | Status: DC

## 2015-08-07 DIAGNOSIS — M5432 Sciatica, left side: Secondary | ICD-10-CM | POA: Diagnosis not present

## 2015-08-07 DIAGNOSIS — M5136 Other intervertebral disc degeneration, lumbar region: Secondary | ICD-10-CM | POA: Diagnosis not present

## 2015-11-10 DIAGNOSIS — R350 Frequency of micturition: Secondary | ICD-10-CM | POA: Diagnosis not present

## 2015-11-10 DIAGNOSIS — N200 Calculus of kidney: Secondary | ICD-10-CM | POA: Diagnosis not present

## 2015-11-11 DIAGNOSIS — N201 Calculus of ureter: Secondary | ICD-10-CM | POA: Diagnosis not present

## 2015-11-12 ENCOUNTER — Other Ambulatory Visit (HOSPITAL_BASED_OUTPATIENT_CLINIC_OR_DEPARTMENT_OTHER): Payer: Self-pay | Admitting: Family Medicine

## 2015-11-12 ENCOUNTER — Ambulatory Visit (HOSPITAL_BASED_OUTPATIENT_CLINIC_OR_DEPARTMENT_OTHER)
Admission: RE | Admit: 2015-11-12 | Discharge: 2015-11-12 | Disposition: A | Discharge status: Home / Self Care | Payer: BLUE CROSS/BLUE SHIELD | Source: Ambulatory Visit | Attending: Family Medicine | Admitting: Family Medicine

## 2015-11-12 DIAGNOSIS — R109 Unspecified abdominal pain: Secondary | ICD-10-CM | POA: Insufficient documentation

## 2015-11-12 DIAGNOSIS — Z87442 Personal history of urinary calculi: Secondary | ICD-10-CM

## 2015-11-12 DIAGNOSIS — N200 Calculus of kidney: Secondary | ICD-10-CM | POA: Diagnosis not present

## 2015-12-31 DIAGNOSIS — D649 Anemia, unspecified: Secondary | ICD-10-CM | POA: Diagnosis not present

## 2015-12-31 DIAGNOSIS — Z0000 Encounter for general adult medical examination without abnormal findings: Secondary | ICD-10-CM | POA: Diagnosis not present

## 2015-12-31 DIAGNOSIS — R55 Syncope and collapse: Secondary | ICD-10-CM | POA: Diagnosis not present

## 2015-12-31 DIAGNOSIS — M797 Fibromyalgia: Secondary | ICD-10-CM | POA: Diagnosis not present

## 2015-12-31 DIAGNOSIS — N951 Menopausal and female climacteric states: Secondary | ICD-10-CM | POA: Diagnosis not present

## 2015-12-31 DIAGNOSIS — Z23 Encounter for immunization: Secondary | ICD-10-CM | POA: Diagnosis not present

## 2015-12-31 DIAGNOSIS — G4701 Insomnia due to medical condition: Secondary | ICD-10-CM | POA: Diagnosis not present

## 2016-01-13 DIAGNOSIS — N184 Chronic kidney disease, stage 4 (severe): Secondary | ICD-10-CM | POA: Diagnosis not present

## 2016-01-20 DIAGNOSIS — I10 Essential (primary) hypertension: Secondary | ICD-10-CM | POA: Diagnosis not present

## 2016-01-20 DIAGNOSIS — N184 Chronic kidney disease, stage 4 (severe): Secondary | ICD-10-CM | POA: Diagnosis not present

## 2016-02-10 DIAGNOSIS — K648 Other hemorrhoids: Secondary | ICD-10-CM | POA: Diagnosis not present

## 2016-02-10 DIAGNOSIS — Z86010 Personal history of colonic polyps: Secondary | ICD-10-CM | POA: Diagnosis not present

## 2016-02-17 DIAGNOSIS — N184 Chronic kidney disease, stage 4 (severe): Secondary | ICD-10-CM | POA: Diagnosis not present

## 2016-02-17 DIAGNOSIS — Z8739 Personal history of other diseases of the musculoskeletal system and connective tissue: Secondary | ICD-10-CM | POA: Diagnosis not present

## 2016-02-17 DIAGNOSIS — R55 Syncope and collapse: Secondary | ICD-10-CM | POA: Diagnosis not present

## 2016-02-29 DIAGNOSIS — R55 Syncope and collapse: Secondary | ICD-10-CM | POA: Diagnosis not present

## 2016-03-08 DIAGNOSIS — R55 Syncope and collapse: Secondary | ICD-10-CM | POA: Diagnosis not present

## 2016-03-08 DIAGNOSIS — Z8739 Personal history of other diseases of the musculoskeletal system and connective tissue: Secondary | ICD-10-CM | POA: Diagnosis not present

## 2016-03-15 DIAGNOSIS — N186 End stage renal disease: Secondary | ICD-10-CM | POA: Diagnosis not present

## 2016-03-15 DIAGNOSIS — Z01818 Encounter for other preprocedural examination: Secondary | ICD-10-CM | POA: Diagnosis not present

## 2016-03-15 DIAGNOSIS — I120 Hypertensive chronic kidney disease with stage 5 chronic kidney disease or end stage renal disease: Secondary | ICD-10-CM | POA: Diagnosis not present

## 2016-03-15 DIAGNOSIS — Z992 Dependence on renal dialysis: Secondary | ICD-10-CM | POA: Diagnosis not present

## 2016-03-22 DIAGNOSIS — N6310 Unspecified lump in the right breast, unspecified quadrant: Secondary | ICD-10-CM | POA: Diagnosis not present

## 2016-04-20 DIAGNOSIS — J029 Acute pharyngitis, unspecified: Secondary | ICD-10-CM | POA: Diagnosis not present

## 2016-04-27 DIAGNOSIS — N184 Chronic kidney disease, stage 4 (severe): Secondary | ICD-10-CM | POA: Diagnosis not present

## 2016-05-03 DIAGNOSIS — I10 Essential (primary) hypertension: Secondary | ICD-10-CM | POA: Diagnosis not present

## 2016-05-03 DIAGNOSIS — N184 Chronic kidney disease, stage 4 (severe): Secondary | ICD-10-CM | POA: Diagnosis not present

## 2016-05-16 DIAGNOSIS — N184 Chronic kidney disease, stage 4 (severe): Secondary | ICD-10-CM | POA: Diagnosis not present

## 2016-05-27 ENCOUNTER — Encounter (HOSPITAL_BASED_OUTPATIENT_CLINIC_OR_DEPARTMENT_OTHER): Payer: Self-pay | Admitting: *Deleted

## 2016-05-27 ENCOUNTER — Emergency Department (HOSPITAL_BASED_OUTPATIENT_CLINIC_OR_DEPARTMENT_OTHER): Payer: BLUE CROSS/BLUE SHIELD

## 2016-05-27 ENCOUNTER — Emergency Department (HOSPITAL_BASED_OUTPATIENT_CLINIC_OR_DEPARTMENT_OTHER)
Admission: EM | Admit: 2016-05-27 | Discharge: 2016-05-27 | Disposition: A | Discharge status: Home / Self Care | Payer: BLUE CROSS/BLUE SHIELD | Attending: Emergency Medicine | Admitting: Emergency Medicine

## 2016-05-27 DIAGNOSIS — R55 Syncope and collapse: Secondary | ICD-10-CM | POA: Diagnosis not present

## 2016-05-27 DIAGNOSIS — R079 Chest pain, unspecified: Secondary | ICD-10-CM | POA: Diagnosis not present

## 2016-05-27 DIAGNOSIS — N183 Chronic kidney disease, stage 3 (moderate): Secondary | ICD-10-CM | POA: Diagnosis not present

## 2016-05-27 DIAGNOSIS — R51 Headache: Secondary | ICD-10-CM | POA: Diagnosis not present

## 2016-05-27 DIAGNOSIS — M79602 Pain in left arm: Secondary | ICD-10-CM

## 2016-05-27 DIAGNOSIS — Z79899 Other long term (current) drug therapy: Secondary | ICD-10-CM | POA: Diagnosis not present

## 2016-05-27 LAB — DIFFERENTIAL
Basophils Absolute: 0.1 10*3/uL (ref 0.0–0.1)
Basophils Relative: 1 %
Eosinophils Absolute: 0.6 10*3/uL (ref 0.0–0.7)
Eosinophils Relative: 5 %
Lymphocytes Relative: 27 %
Lymphs Abs: 3.2 10*3/uL (ref 0.7–4.0)
Monocytes Absolute: 1.5 10*3/uL — ABNORMAL HIGH (ref 0.1–1.0)
Monocytes Relative: 13 %
Neutro Abs: 6.3 10*3/uL (ref 1.7–7.7)
Neutrophils Relative %: 54 %

## 2016-05-27 LAB — CBC
HCT: 33.5 % — ABNORMAL LOW (ref 36.0–46.0)
Hemoglobin: 10.8 g/dL — ABNORMAL LOW (ref 12.0–15.0)
MCH: 30.3 pg (ref 26.0–34.0)
MCHC: 32.2 g/dL (ref 30.0–36.0)
MCV: 93.8 fL (ref 78.0–100.0)
Platelets: 338 10*3/uL (ref 150–400)
RBC: 3.57 MIL/uL — ABNORMAL LOW (ref 3.87–5.11)
RDW: 13.5 % (ref 11.5–15.5)
WBC: 11.7 10*3/uL — ABNORMAL HIGH (ref 4.0–10.5)

## 2016-05-27 LAB — TROPONIN I
Troponin I: <0.03 ng/mL (ref <0.03)
Troponin I: <0.03 ng/mL (ref <0.03)

## 2016-05-27 LAB — COMPREHENSIVE METABOLIC PANEL
ALT: 29 U/L (ref 14–54)
AST: 30 U/L (ref 15–41)
Albumin: 3.9 g/dL (ref 3.5–5.0)
Alkaline Phosphatase: 100 U/L (ref 38–126)
Anion gap: 6 (ref 5–15)
BUN: 24 mg/dL — ABNORMAL HIGH (ref 6–20)
CO2: 23 mmol/L (ref 22–32)
Calcium: 8.4 mg/dL — ABNORMAL LOW (ref 8.9–10.3)
Chloride: 109 mmol/L (ref 101–111)
Creatinine, Ser: 2.36 mg/dL — ABNORMAL HIGH (ref 0.44–1.00)
GFR calc Af Amer: 26 mL/min — ABNORMAL LOW (ref >60)
GFR calc non Af Amer: 23 mL/min — ABNORMAL LOW (ref >60)
Glucose, Bld: 155 mg/dL — ABNORMAL HIGH (ref 65–99)
Potassium: 3.3 mmol/L — ABNORMAL LOW (ref 3.5–5.1)
Sodium: 138 mmol/L (ref 135–145)
Total Bilirubin: 0.5 mg/dL (ref 0.3–1.2)
Total Protein: 7.7 g/dL (ref 6.5–8.1)

## 2016-05-27 LAB — PROTIME-INR
INR: 1
Prothrombin Time: 13.2 seconds (ref 11.4–15.2)

## 2016-05-27 LAB — APTT: aPTT: 32 seconds (ref 24–36)

## 2016-05-27 NOTE — ED Notes (Signed)
Patient transported to CT 

## 2016-05-27 NOTE — ED Provider Notes (Signed)
[facility] Provider Note   CSN: [id number] Arrival date & time: 05/27/16  1622     History   Chief Complaint Chief Complaint  Patient presents with  . Arm Pain    HPI STEVANA DUFNER is a 51 y.o. female.  The history is provided by the patient. No language interpreter was used.  Arm Pain     ZYONNA VARDAMAN is a 51 y.o. female who presents to the Emergency Department complaining of arm pain.  She was dropping her boyfriend off at [employer] and began to feel lightheaded and woozy like she might pass out.  Sxs started at 330 pm.  She feels associated pain in her left shoulder, left upper arm described as a squeezing sensation with tingling in her left hand. She denies  She had a few brief sharp pains in her left lateral chest.  No headache.  Her eyes felt fuzzy.  She feels like she has to take a deep breath, no pleuritic pain.  She had associated diaphoresis.  Both legs feel like they are trembling.  She has Stage IV kidney disease, fibromyalgia, sjorgrens.  Mother with hx/o CAD in 87s.    Past Medical History:  Diagnosis Date  . Anxiety   . Blood dyscrasia    remission of ITP  . Chronic kidney disease    THIRD STAGE KIDNEY DISEASE - 30%  . Fibromyalgia   . Sjogren's syndrome ([place])   . TMJ click   . TMJ syndrome     There are no active problems to display for this patient.   Past Surgical History:  Procedure Laterality Date  . APPENDECTOMY    . BREAST SURGERY     AUGMENTATION  . HYSTEROSCOPY W/D&C  03/28/2012   Procedure: DILATATION AND CURETTAGE /HYSTEROSCOPY;  Surgeon: Thurnell Lose, MD;  Location: [facility];  Service: Gynecology;  Laterality: N/A;  . SPLEENECTOMY  12/05/83   had both (yes, she had two) spleens removed this date  . TONSILLECTOMY      OB History    No data available       Home Medications    Prior to Admission medications   Medication Sig Start Date End Date Taking? Authorizing Provider  GABAPENTIN PO Take by mouth.   Yes Historical  Provider, MD  ALPRAZolam (XANAX) 0.25 MG tablet Take 0.25 mg by mouth as needed. For anxiety    Historical Provider, MD  cevimeline (EVOXAC) 30 MG capsule Take 30 mg by mouth 3 (three) times daily.    Historical Provider, MD  cyclobenzaprine (FLEXERIL) 10 MG tablet Take 10 mg by mouth 2 (two) times daily. For muscle spasms; TMJ    Historical Provider, MD  cycloSPORINE (RESTASIS) 0.05 % ophthalmic emulsion Place 1 drop into both eyes daily as needed (dry eyes).    Historical Provider, MD  Estradiol (MINIVELLE TD) Place 1 patch onto the skin.    Historical Provider, MD  Estradiol-Norethindrone Acet [facility] TD) Place 1 patch onto the skin 2 (two) times a week.    Historical Provider, MD  HYDROcodone-acetaminophen (NORCO/VICODIN) 5-325 MG per tablet Take 1-2 tablets by mouth every 6 (six) hours as needed. For pain    Historical Provider, MD  Linaclotide (LINZESS PO) Take 1 tablet by mouth daily.    Historical Provider, MD  Milnacipran HCl (SAVELLA) 100 MG TABS tablet Take 100 mg by mouth 2 (two) times daily.    Historical Provider, MD  Multiple Vitamins-Minerals (MULTIVITAMIN WITH MINERALS) tablet Take 1 tablet by mouth  daily.    Historical Provider, MD  SUMAtriptan (IMITREX) 100 MG tablet Take 100 mg by mouth every 2 (two) hours as needed. For migraines    Historical Provider, MD  zolpidem (AMBIEN) 10 MG tablet Take 5 mg by mouth at bedtime. For insomnia    Historical Provider, MD    Family History No family history on file.  Social History Social History  Substance Use Topics  . Smoking status: Never Smoker  . Smokeless tobacco: Never Used  . Alcohol use 0.6 oz/week    1 Glasses of wine per week     Comment: occasionally     Allergies   Aspirin   Review of Systems Review of Systems  All other systems reviewed and are negative.    Physical Exam Updated Vital Signs BP 120/88 (BP Location: Left Arm)   Pulse 90   Temp 98.6 F (37 C)   Resp 18   Ht 5\' 4"  (1.626 m)   Wt 120  lb (54.4 kg)   SpO2 100%   BMI 20.60 kg/m   Physical Exam  Constitutional: She is oriented to person, place, and time. She appears well-developed and well-nourished.  HENT:  Head: Normocephalic and atraumatic.  Eyes: EOM are normal. Pupils are equal, round, and reactive to light.  Cardiovascular: Normal rate and regular rhythm.   No murmur heard. Pulmonary/Chest: Effort normal and breath sounds normal. No respiratory distress.  Abdominal: Soft. There is no tenderness. There is no rebound and no guarding.  Musculoskeletal: She exhibits no edema or tenderness.  2+ radial pulses bilaterally  Neurological: She is alert and oriented to person, place, and time. No cranial nerve deficit. Coordination normal.  5/5 strength in all four extremities  Skin: Skin is warm and dry.  Psychiatric: She has a normal mood and affect. Her behavior is normal.  Nursing note and vitals reviewed.    ED Treatments / Results  Labs (all labs ordered are listed, but only abnormal results are displayed) Labs Reviewed  CBC - Abnormal; Notable for the following:       Result Value   WBC 11.7 (*)    RBC 3.57 (*)    Hemoglobin 10.8 (*)    HCT 33.5 (*)    All other components within normal limits  DIFFERENTIAL - Abnormal; Notable for the following:    Monocytes Absolute 1.5 (*)    All other components within normal limits  COMPREHENSIVE METABOLIC PANEL - Abnormal; Notable for the following:    Potassium 3.3 (*)    Glucose, Bld 155 (*)    BUN 24 (*)    Creatinine, Ser 2.36 (*)    Calcium 8.4 (*)    GFR calc non Af Amer 23 (*)    GFR calc Af Amer 26 (*)    All other components within normal limits  PROTIME-INR  APTT  TROPONIN I  TROPONIN I  CBG MONITORING, ED    EKG  EKG Interpretation None       Radiology Dg Chest 2 View  Result Date: 05/27/2016 CLINICAL DATA:  Sudden on set of pain and tingling in her left arm EXAM: CHEST  2 VIEW COMPARISON:  None. FINDINGS: The heart size and mediastinal  contours are within normal limits. Both lungs are clear. The visualized skeletal structures are unremarkable. IMPRESSION: No active cardiopulmonary disease. No evidence of pneumonia or pulmonary edema. Electronically Signed   By: Franki Cabot M.D.   On: 05/27/2016 17:35   Ct Head Wo Contrast  Result  Date: 05/27/2016 CLINICAL DATA:  Sudden onset of pain and tingling in the left arm. EXAM: CT HEAD WITHOUT CONTRAST TECHNIQUE: Contiguous axial images were obtained from the base of the skull through the vertex without intravenous contrast. COMPARISON:  None. FINDINGS: BRAIN: The ventricles and sulci are normal. No intraparenchymal hemorrhage, mass effect nor midline shift. No acute large vascular territory infarcts. Grey-white matter distinction is maintained. The basal ganglia are unremarkable. No abnormal extra-axial fluid collections. Basal cisterns are not effaced and midline. The brainstem and cerebellar hemispheres are without acute abnormalities. VASCULAR: Minimal atherosclerotic calcifications of the cavernous sinus carotids. No hyperdense vessels. SKULL/SOFT TISSUES: No skull fracture. No significant soft tissue swelling. ORBITS/SINUSES: The included ocular globes and orbital contents are normal.The mastoid air cells are clear. The included paranasal sinuses are well-aerated. OTHER: None. IMPRESSION: No acute intracranial abnormality. Electronically Signed   By: Ashley Royalty M.D.   On: 05/27/2016 17:33    Procedures Procedures (including critical care time)  Medications Ordered in ED Medications - No data to display   Initial Impression / Assessment and Plan / ED Course  I have reviewed the triage vital signs and the nursing notes.  Pertinent labs & imaging results that were available during my care of the patient were reviewed by me and considered in my medical decision making (see chart for details).     Patient with history of CK D here for evaluation of pain in her left arm with near  syncopal type sensation. Current clinical picture is not consistent with ACS, PE, CVA, dissection. She is neurologically intact in the emergency department and is asymptomatic. Discussed with patient unclear etiology of her symptoms. Discussed patient's findings of labs with anemia and elevated creatinine, she states these are at her baseline numbness followed by [facility] physicians. Discussed close outpatient follow-up as well as return precautions.  Final Clinical Impressions(s) / ED Diagnoses   Final diagnoses:  Left arm pain  Near syncope    New Prescriptions New Prescriptions   No medications on file     Quintella Reichert, MD 05/27/16 2034

## 2016-05-27 NOTE — ED Triage Notes (Signed)
Pain and tingling in her left arm. She is having difficulty keeping her eyes open at triage. States she takes a lot of medication.

## 2016-06-22 DIAGNOSIS — Z01818 Encounter for other preprocedural examination: Secondary | ICD-10-CM | POA: Diagnosis not present

## 2016-06-22 DIAGNOSIS — G43909 Migraine, unspecified, not intractable, without status migrainosus: Secondary | ICD-10-CM | POA: Diagnosis not present

## 2016-06-22 DIAGNOSIS — M3500 Sicca syndrome, unspecified: Secondary | ICD-10-CM | POA: Diagnosis not present

## 2016-06-22 DIAGNOSIS — N186 End stage renal disease: Secondary | ICD-10-CM | POA: Diagnosis not present

## 2016-06-22 DIAGNOSIS — M549 Dorsalgia, unspecified: Secondary | ICD-10-CM | POA: Diagnosis not present

## 2016-06-22 DIAGNOSIS — I129 Hypertensive chronic kidney disease with stage 1 through stage 4 chronic kidney disease, or unspecified chronic kidney disease: Secondary | ICD-10-CM | POA: Diagnosis not present

## 2016-06-22 DIAGNOSIS — N189 Chronic kidney disease, unspecified: Secondary | ICD-10-CM | POA: Diagnosis not present

## 2016-06-22 DIAGNOSIS — Z7682 Awaiting organ transplant status: Secondary | ICD-10-CM | POA: Diagnosis not present

## 2016-06-22 DIAGNOSIS — R0602 Shortness of breath: Secondary | ICD-10-CM | POA: Diagnosis not present

## 2016-06-22 DIAGNOSIS — R55 Syncope and collapse: Secondary | ICD-10-CM | POA: Diagnosis not present

## 2016-06-22 DIAGNOSIS — M797 Fibromyalgia: Secondary | ICD-10-CM | POA: Diagnosis not present

## 2016-06-22 DIAGNOSIS — M7138 Other bursal cyst, other site: Secondary | ICD-10-CM | POA: Diagnosis not present

## 2016-06-22 DIAGNOSIS — Z9081 Acquired absence of spleen: Secondary | ICD-10-CM | POA: Diagnosis not present

## 2016-06-22 DIAGNOSIS — Z87442 Personal history of urinary calculi: Secondary | ICD-10-CM | POA: Diagnosis not present

## 2016-06-22 DIAGNOSIS — G8929 Other chronic pain: Secondary | ICD-10-CM | POA: Diagnosis not present

## 2016-06-22 DIAGNOSIS — R079 Chest pain, unspecified: Secondary | ICD-10-CM | POA: Diagnosis not present

## 2016-08-24 DIAGNOSIS — N184 Chronic kidney disease, stage 4 (severe): Secondary | ICD-10-CM | POA: Diagnosis not present

## 2016-09-01 DIAGNOSIS — I10 Essential (primary) hypertension: Secondary | ICD-10-CM | POA: Diagnosis not present

## 2016-09-01 DIAGNOSIS — R399 Unspecified symptoms and signs involving the genitourinary system: Secondary | ICD-10-CM | POA: Diagnosis not present

## 2016-09-01 DIAGNOSIS — N184 Chronic kidney disease, stage 4 (severe): Secondary | ICD-10-CM | POA: Diagnosis not present

## 2016-12-28 DIAGNOSIS — N184 Chronic kidney disease, stage 4 (severe): Secondary | ICD-10-CM | POA: Diagnosis not present

## 2017-01-05 DIAGNOSIS — Z23 Encounter for immunization: Secondary | ICD-10-CM | POA: Diagnosis not present

## 2017-01-05 DIAGNOSIS — N184 Chronic kidney disease, stage 4 (severe): Secondary | ICD-10-CM | POA: Diagnosis not present

## 2017-01-05 DIAGNOSIS — I10 Essential (primary) hypertension: Secondary | ICD-10-CM | POA: Diagnosis not present

## 2017-01-12 DIAGNOSIS — G43909 Migraine, unspecified, not intractable, without status migrainosus: Secondary | ICD-10-CM | POA: Diagnosis not present

## 2017-01-12 DIAGNOSIS — M797 Fibromyalgia: Secondary | ICD-10-CM | POA: Diagnosis not present

## 2017-01-12 DIAGNOSIS — G4701 Insomnia due to medical condition: Secondary | ICD-10-CM | POA: Diagnosis not present

## 2017-01-12 DIAGNOSIS — Z0000 Encounter for general adult medical examination without abnormal findings: Secondary | ICD-10-CM | POA: Diagnosis not present

## 2017-01-12 DIAGNOSIS — J309 Allergic rhinitis, unspecified: Secondary | ICD-10-CM | POA: Diagnosis not present

## 2017-01-16 ENCOUNTER — Other Ambulatory Visit: Payer: Self-pay | Admitting: Family Medicine

## 2017-01-16 DIAGNOSIS — M5412 Radiculopathy, cervical region: Secondary | ICD-10-CM

## 2017-01-21 ENCOUNTER — Ambulatory Visit
Admission: RE | Admit: 2017-01-21 | Discharge: 2017-01-21 | Disposition: A | Discharge status: Home / Self Care | Payer: BLUE CROSS/BLUE SHIELD | Source: Ambulatory Visit | Attending: Family Medicine | Admitting: Family Medicine

## 2017-01-21 DIAGNOSIS — M5412 Radiculopathy, cervical region: Secondary | ICD-10-CM

## 2017-01-21 DIAGNOSIS — M542 Cervicalgia: Secondary | ICD-10-CM | POA: Diagnosis not present

## 2017-02-16 DIAGNOSIS — M4722 Other spondylosis with radiculopathy, cervical region: Secondary | ICD-10-CM | POA: Diagnosis not present

## 2017-02-23 DIAGNOSIS — M542 Cervicalgia: Secondary | ICD-10-CM | POA: Diagnosis not present

## 2017-02-23 DIAGNOSIS — M6281 Muscle weakness (generalized): Secondary | ICD-10-CM | POA: Diagnosis not present

## 2017-02-23 DIAGNOSIS — M5412 Radiculopathy, cervical region: Secondary | ICD-10-CM | POA: Diagnosis not present

## 2017-02-23 DIAGNOSIS — M2560 Stiffness of unspecified joint, not elsewhere classified: Secondary | ICD-10-CM | POA: Diagnosis not present

## 2017-02-27 DIAGNOSIS — M5412 Radiculopathy, cervical region: Secondary | ICD-10-CM | POA: Diagnosis not present

## 2017-02-27 DIAGNOSIS — M542 Cervicalgia: Secondary | ICD-10-CM | POA: Diagnosis not present

## 2017-02-27 DIAGNOSIS — M2560 Stiffness of unspecified joint, not elsewhere classified: Secondary | ICD-10-CM | POA: Diagnosis not present

## 2017-02-27 DIAGNOSIS — M6281 Muscle weakness (generalized): Secondary | ICD-10-CM | POA: Diagnosis not present

## 2017-03-02 DIAGNOSIS — M5412 Radiculopathy, cervical region: Secondary | ICD-10-CM | POA: Diagnosis not present

## 2017-03-02 DIAGNOSIS — M6281 Muscle weakness (generalized): Secondary | ICD-10-CM | POA: Diagnosis not present

## 2017-03-02 DIAGNOSIS — M542 Cervicalgia: Secondary | ICD-10-CM | POA: Diagnosis not present

## 2017-03-02 DIAGNOSIS — M2560 Stiffness of unspecified joint, not elsewhere classified: Secondary | ICD-10-CM | POA: Diagnosis not present

## 2017-03-06 DIAGNOSIS — M542 Cervicalgia: Secondary | ICD-10-CM | POA: Diagnosis not present

## 2017-03-06 DIAGNOSIS — M5412 Radiculopathy, cervical region: Secondary | ICD-10-CM | POA: Diagnosis not present

## 2017-03-06 DIAGNOSIS — M2560 Stiffness of unspecified joint, not elsewhere classified: Secondary | ICD-10-CM | POA: Diagnosis not present

## 2017-03-06 DIAGNOSIS — M6281 Muscle weakness (generalized): Secondary | ICD-10-CM | POA: Diagnosis not present

## 2017-03-08 DIAGNOSIS — M542 Cervicalgia: Secondary | ICD-10-CM | POA: Diagnosis not present

## 2017-03-08 DIAGNOSIS — M2560 Stiffness of unspecified joint, not elsewhere classified: Secondary | ICD-10-CM | POA: Diagnosis not present

## 2017-03-08 DIAGNOSIS — M6281 Muscle weakness (generalized): Secondary | ICD-10-CM | POA: Diagnosis not present

## 2017-03-08 DIAGNOSIS — M5412 Radiculopathy, cervical region: Secondary | ICD-10-CM | POA: Diagnosis not present

## 2017-03-14 DIAGNOSIS — M6281 Muscle weakness (generalized): Secondary | ICD-10-CM | POA: Diagnosis not present

## 2017-03-14 DIAGNOSIS — M5412 Radiculopathy, cervical region: Secondary | ICD-10-CM | POA: Diagnosis not present

## 2017-03-14 DIAGNOSIS — M542 Cervicalgia: Secondary | ICD-10-CM | POA: Diagnosis not present

## 2017-03-14 DIAGNOSIS — M2560 Stiffness of unspecified joint, not elsewhere classified: Secondary | ICD-10-CM | POA: Diagnosis not present

## 2017-03-16 DIAGNOSIS — M5412 Radiculopathy, cervical region: Secondary | ICD-10-CM | POA: Diagnosis not present

## 2017-03-16 DIAGNOSIS — M542 Cervicalgia: Secondary | ICD-10-CM | POA: Diagnosis not present

## 2017-03-16 DIAGNOSIS — M2560 Stiffness of unspecified joint, not elsewhere classified: Secondary | ICD-10-CM | POA: Diagnosis not present

## 2017-03-16 DIAGNOSIS — M6281 Muscle weakness (generalized): Secondary | ICD-10-CM | POA: Diagnosis not present

## 2017-03-21 DIAGNOSIS — M542 Cervicalgia: Secondary | ICD-10-CM | POA: Diagnosis not present

## 2017-03-21 DIAGNOSIS — M5412 Radiculopathy, cervical region: Secondary | ICD-10-CM | POA: Diagnosis not present

## 2017-03-21 DIAGNOSIS — M6281 Muscle weakness (generalized): Secondary | ICD-10-CM | POA: Diagnosis not present

## 2017-03-21 DIAGNOSIS — M2560 Stiffness of unspecified joint, not elsewhere classified: Secondary | ICD-10-CM | POA: Diagnosis not present

## 2017-03-23 DIAGNOSIS — M2560 Stiffness of unspecified joint, not elsewhere classified: Secondary | ICD-10-CM | POA: Diagnosis not present

## 2017-03-23 DIAGNOSIS — M5412 Radiculopathy, cervical region: Secondary | ICD-10-CM | POA: Diagnosis not present

## 2017-03-23 DIAGNOSIS — M6281 Muscle weakness (generalized): Secondary | ICD-10-CM | POA: Diagnosis not present

## 2017-03-23 DIAGNOSIS — M542 Cervicalgia: Secondary | ICD-10-CM | POA: Diagnosis not present

## 2017-05-16 DIAGNOSIS — D631 Anemia in chronic kidney disease: Secondary | ICD-10-CM | POA: Diagnosis not present

## 2017-05-16 DIAGNOSIS — N2581 Secondary hyperparathyroidism of renal origin: Secondary | ICD-10-CM | POA: Diagnosis not present

## 2017-05-16 DIAGNOSIS — N184 Chronic kidney disease, stage 4 (severe): Secondary | ICD-10-CM | POA: Diagnosis not present

## 2017-05-19 DIAGNOSIS — N2581 Secondary hyperparathyroidism of renal origin: Secondary | ICD-10-CM | POA: Diagnosis not present

## 2017-05-19 DIAGNOSIS — N184 Chronic kidney disease, stage 4 (severe): Secondary | ICD-10-CM | POA: Diagnosis not present

## 2017-05-19 DIAGNOSIS — D631 Anemia in chronic kidney disease: Secondary | ICD-10-CM | POA: Diagnosis not present

## 2017-05-19 DIAGNOSIS — I129 Hypertensive chronic kidney disease with stage 1 through stage 4 chronic kidney disease, or unspecified chronic kidney disease: Secondary | ICD-10-CM | POA: Diagnosis not present

## 2017-07-13 DIAGNOSIS — G43909 Migraine, unspecified, not intractable, without status migrainosus: Secondary | ICD-10-CM | POA: Diagnosis not present

## 2017-07-13 DIAGNOSIS — G4701 Insomnia due to medical condition: Secondary | ICD-10-CM | POA: Diagnosis not present

## 2017-07-13 DIAGNOSIS — M25551 Pain in right hip: Secondary | ICD-10-CM | POA: Diagnosis not present

## 2017-07-13 DIAGNOSIS — R350 Frequency of micturition: Secondary | ICD-10-CM | POA: Diagnosis not present

## 2017-07-13 DIAGNOSIS — N184 Chronic kidney disease, stage 4 (severe): Secondary | ICD-10-CM | POA: Diagnosis not present

## 2017-07-13 DIAGNOSIS — M797 Fibromyalgia: Secondary | ICD-10-CM | POA: Diagnosis not present

## 2017-09-27 DIAGNOSIS — N184 Chronic kidney disease, stage 4 (severe): Secondary | ICD-10-CM | POA: Diagnosis not present

## 2017-10-05 DIAGNOSIS — D631 Anemia in chronic kidney disease: Secondary | ICD-10-CM | POA: Diagnosis not present

## 2017-10-05 DIAGNOSIS — N2581 Secondary hyperparathyroidism of renal origin: Secondary | ICD-10-CM | POA: Diagnosis not present

## 2017-10-05 DIAGNOSIS — N184 Chronic kidney disease, stage 4 (severe): Secondary | ICD-10-CM | POA: Diagnosis not present

## 2017-10-05 DIAGNOSIS — I129 Hypertensive chronic kidney disease with stage 1 through stage 4 chronic kidney disease, or unspecified chronic kidney disease: Secondary | ICD-10-CM | POA: Diagnosis not present

## 2018-01-15 DIAGNOSIS — N952 Postmenopausal atrophic vaginitis: Secondary | ICD-10-CM | POA: Diagnosis not present

## 2018-01-15 DIAGNOSIS — G43909 Migraine, unspecified, not intractable, without status migrainosus: Secondary | ICD-10-CM | POA: Diagnosis not present

## 2018-01-15 DIAGNOSIS — Z0000 Encounter for general adult medical examination without abnormal findings: Secondary | ICD-10-CM | POA: Diagnosis not present

## 2018-01-15 DIAGNOSIS — K219 Gastro-esophageal reflux disease without esophagitis: Secondary | ICD-10-CM | POA: Diagnosis not present

## 2018-01-15 DIAGNOSIS — R87610 Atypical squamous cells of undetermined significance on cytologic smear of cervix (ASC-US): Secondary | ICD-10-CM | POA: Diagnosis not present

## 2018-01-15 DIAGNOSIS — M797 Fibromyalgia: Secondary | ICD-10-CM | POA: Diagnosis not present

## 2018-01-15 DIAGNOSIS — Z124 Encounter for screening for malignant neoplasm of cervix: Secondary | ICD-10-CM | POA: Diagnosis not present

## 2018-01-16 DIAGNOSIS — Z136 Encounter for screening for cardiovascular disorders: Secondary | ICD-10-CM | POA: Diagnosis not present

## 2018-01-29 DIAGNOSIS — G4452 New daily persistent headache (NDPH): Secondary | ICD-10-CM | POA: Diagnosis not present

## 2018-01-30 DIAGNOSIS — Z79899 Other long term (current) drug therapy: Secondary | ICD-10-CM | POA: Diagnosis not present

## 2018-01-30 DIAGNOSIS — R51 Headache: Secondary | ICD-10-CM | POA: Diagnosis not present

## 2018-01-30 DIAGNOSIS — G4485 Primary stabbing headache: Secondary | ICD-10-CM | POA: Diagnosis not present

## 2018-01-30 DIAGNOSIS — Z049 Encounter for examination and observation for unspecified reason: Secondary | ICD-10-CM | POA: Diagnosis not present

## 2018-01-30 DIAGNOSIS — G43109 Migraine with aura, not intractable, without status migrainosus: Secondary | ICD-10-CM | POA: Diagnosis not present

## 2018-01-31 ENCOUNTER — Other Ambulatory Visit: Payer: Self-pay | Admitting: Specialist

## 2018-01-31 DIAGNOSIS — G4485 Primary stabbing headache: Secondary | ICD-10-CM | POA: Diagnosis not present

## 2018-01-31 DIAGNOSIS — N2581 Secondary hyperparathyroidism of renal origin: Secondary | ICD-10-CM | POA: Diagnosis not present

## 2018-01-31 DIAGNOSIS — G43109 Migraine with aura, not intractable, without status migrainosus: Secondary | ICD-10-CM | POA: Diagnosis not present

## 2018-01-31 DIAGNOSIS — M542 Cervicalgia: Secondary | ICD-10-CM | POA: Diagnosis not present

## 2018-01-31 DIAGNOSIS — N184 Chronic kidney disease, stage 4 (severe): Secondary | ICD-10-CM | POA: Diagnosis not present

## 2018-01-31 DIAGNOSIS — M791 Myalgia, unspecified site: Secondary | ICD-10-CM | POA: Diagnosis not present

## 2018-01-31 DIAGNOSIS — G518 Other disorders of facial nerve: Secondary | ICD-10-CM | POA: Diagnosis not present

## 2018-02-07 DIAGNOSIS — I129 Hypertensive chronic kidney disease with stage 1 through stage 4 chronic kidney disease, or unspecified chronic kidney disease: Secondary | ICD-10-CM | POA: Diagnosis not present

## 2018-02-07 DIAGNOSIS — N184 Chronic kidney disease, stage 4 (severe): Secondary | ICD-10-CM | POA: Diagnosis not present

## 2018-02-07 DIAGNOSIS — N2581 Secondary hyperparathyroidism of renal origin: Secondary | ICD-10-CM | POA: Diagnosis not present

## 2018-02-07 DIAGNOSIS — D631 Anemia in chronic kidney disease: Secondary | ICD-10-CM | POA: Diagnosis not present

## 2018-02-12 DIAGNOSIS — G4485 Primary stabbing headache: Secondary | ICD-10-CM | POA: Diagnosis not present

## 2018-02-12 DIAGNOSIS — M791 Myalgia, unspecified site: Secondary | ICD-10-CM | POA: Diagnosis not present

## 2018-02-12 DIAGNOSIS — M542 Cervicalgia: Secondary | ICD-10-CM | POA: Diagnosis not present

## 2018-02-12 DIAGNOSIS — G518 Other disorders of facial nerve: Secondary | ICD-10-CM | POA: Diagnosis not present

## 2018-02-12 DIAGNOSIS — G43109 Migraine with aura, not intractable, without status migrainosus: Secondary | ICD-10-CM | POA: Diagnosis not present

## 2018-02-13 ENCOUNTER — Ambulatory Visit
Admission: RE | Admit: 2018-02-13 | Discharge: 2018-02-13 | Disposition: A | Discharge status: Home / Self Care | Payer: BLUE CROSS/BLUE SHIELD | Source: Ambulatory Visit | Attending: Specialist | Admitting: Specialist

## 2018-02-13 DIAGNOSIS — R51 Headache: Secondary | ICD-10-CM | POA: Diagnosis not present

## 2018-02-13 DIAGNOSIS — G4485 Primary stabbing headache: Secondary | ICD-10-CM

## 2018-03-01 DIAGNOSIS — G43109 Migraine with aura, not intractable, without status migrainosus: Secondary | ICD-10-CM | POA: Diagnosis not present

## 2018-03-01 DIAGNOSIS — M542 Cervicalgia: Secondary | ICD-10-CM | POA: Diagnosis not present

## 2018-03-01 DIAGNOSIS — M791 Myalgia, unspecified site: Secondary | ICD-10-CM | POA: Diagnosis not present

## 2018-03-01 DIAGNOSIS — G4485 Primary stabbing headache: Secondary | ICD-10-CM | POA: Diagnosis not present

## 2018-03-01 DIAGNOSIS — G518 Other disorders of facial nerve: Secondary | ICD-10-CM | POA: Diagnosis not present

## 2018-03-07 ENCOUNTER — Other Ambulatory Visit: Payer: Self-pay | Admitting: Obstetrics and Gynecology

## 2018-03-07 DIAGNOSIS — N870 Mild cervical dysplasia: Secondary | ICD-10-CM | POA: Diagnosis not present

## 2018-03-07 DIAGNOSIS — L292 Pruritus vulvae: Secondary | ICD-10-CM | POA: Diagnosis not present

## 2018-03-07 DIAGNOSIS — N871 Moderate cervical dysplasia: Secondary | ICD-10-CM | POA: Diagnosis not present

## 2018-04-19 ENCOUNTER — Other Ambulatory Visit: Payer: Self-pay | Admitting: Obstetrics and Gynecology

## 2018-04-19 DIAGNOSIS — N871 Moderate cervical dysplasia: Secondary | ICD-10-CM | POA: Diagnosis not present

## 2018-04-19 DIAGNOSIS — N870 Mild cervical dysplasia: Secondary | ICD-10-CM | POA: Diagnosis not present

## 2018-05-02 DIAGNOSIS — R109 Unspecified abdominal pain: Secondary | ICD-10-CM | POA: Diagnosis not present

## 2018-06-07 DIAGNOSIS — N184 Chronic kidney disease, stage 4 (severe): Secondary | ICD-10-CM | POA: Diagnosis not present

## 2018-06-15 DIAGNOSIS — N2581 Secondary hyperparathyroidism of renal origin: Secondary | ICD-10-CM | POA: Diagnosis not present

## 2018-06-15 DIAGNOSIS — D631 Anemia in chronic kidney disease: Secondary | ICD-10-CM | POA: Diagnosis not present

## 2018-06-15 DIAGNOSIS — N184 Chronic kidney disease, stage 4 (severe): Secondary | ICD-10-CM | POA: Diagnosis not present

## 2018-06-15 DIAGNOSIS — I10 Essential (primary) hypertension: Secondary | ICD-10-CM | POA: Diagnosis not present

## 2018-10-03 DIAGNOSIS — N184 Chronic kidney disease, stage 4 (severe): Secondary | ICD-10-CM | POA: Diagnosis not present

## 2018-10-12 DIAGNOSIS — D631 Anemia in chronic kidney disease: Secondary | ICD-10-CM | POA: Diagnosis not present

## 2018-10-12 DIAGNOSIS — I10 Essential (primary) hypertension: Secondary | ICD-10-CM | POA: Diagnosis not present

## 2018-10-12 DIAGNOSIS — N184 Chronic kidney disease, stage 4 (severe): Secondary | ICD-10-CM | POA: Diagnosis not present

## 2018-10-12 DIAGNOSIS — N2581 Secondary hyperparathyroidism of renal origin: Secondary | ICD-10-CM | POA: Diagnosis not present

## 2019-01-17 DIAGNOSIS — Z23 Encounter for immunization: Secondary | ICD-10-CM | POA: Diagnosis not present

## 2019-01-17 DIAGNOSIS — Z0000 Encounter for general adult medical examination without abnormal findings: Secondary | ICD-10-CM | POA: Diagnosis not present

## 2019-02-04 DIAGNOSIS — N2581 Secondary hyperparathyroidism of renal origin: Secondary | ICD-10-CM | POA: Diagnosis not present

## 2019-02-04 DIAGNOSIS — N184 Chronic kidney disease, stage 4 (severe): Secondary | ICD-10-CM | POA: Diagnosis not present

## 2019-02-04 DIAGNOSIS — N189 Chronic kidney disease, unspecified: Secondary | ICD-10-CM | POA: Diagnosis not present

## 2019-02-08 DIAGNOSIS — I10 Essential (primary) hypertension: Secondary | ICD-10-CM | POA: Diagnosis not present

## 2019-02-08 DIAGNOSIS — N184 Chronic kidney disease, stage 4 (severe): Secondary | ICD-10-CM | POA: Diagnosis not present

## 2019-02-08 DIAGNOSIS — D631 Anemia in chronic kidney disease: Secondary | ICD-10-CM | POA: Diagnosis not present

## 2019-02-08 DIAGNOSIS — N2581 Secondary hyperparathyroidism of renal origin: Secondary | ICD-10-CM | POA: Diagnosis not present

## 2019-06-05 DIAGNOSIS — N184 Chronic kidney disease, stage 4 (severe): Secondary | ICD-10-CM | POA: Diagnosis not present

## 2019-06-14 DIAGNOSIS — I10 Essential (primary) hypertension: Secondary | ICD-10-CM | POA: Diagnosis not present

## 2019-06-14 DIAGNOSIS — N184 Chronic kidney disease, stage 4 (severe): Secondary | ICD-10-CM | POA: Diagnosis not present

## 2019-06-14 DIAGNOSIS — D631 Anemia in chronic kidney disease: Secondary | ICD-10-CM | POA: Diagnosis not present

## 2019-06-14 DIAGNOSIS — N2581 Secondary hyperparathyroidism of renal origin: Secondary | ICD-10-CM | POA: Diagnosis not present

## 2019-09-04 DIAGNOSIS — N184 Chronic kidney disease, stage 4 (severe): Secondary | ICD-10-CM | POA: Diagnosis not present

## 2019-09-13 DIAGNOSIS — D631 Anemia in chronic kidney disease: Secondary | ICD-10-CM | POA: Diagnosis not present

## 2019-09-13 DIAGNOSIS — N2581 Secondary hyperparathyroidism of renal origin: Secondary | ICD-10-CM | POA: Diagnosis not present

## 2019-09-13 DIAGNOSIS — I10 Essential (primary) hypertension: Secondary | ICD-10-CM | POA: Diagnosis not present

## 2019-09-13 DIAGNOSIS — N184 Chronic kidney disease, stage 4 (severe): Secondary | ICD-10-CM | POA: Diagnosis not present

## 2019-12-03 DIAGNOSIS — N184 Chronic kidney disease, stage 4 (severe): Secondary | ICD-10-CM | POA: Diagnosis not present

## 2019-12-13 DIAGNOSIS — N184 Chronic kidney disease, stage 4 (severe): Secondary | ICD-10-CM | POA: Diagnosis not present

## 2019-12-13 DIAGNOSIS — N2581 Secondary hyperparathyroidism of renal origin: Secondary | ICD-10-CM | POA: Diagnosis not present

## 2019-12-13 DIAGNOSIS — I10 Essential (primary) hypertension: Secondary | ICD-10-CM | POA: Diagnosis not present

## 2019-12-13 DIAGNOSIS — D631 Anemia in chronic kidney disease: Secondary | ICD-10-CM | POA: Diagnosis not present

## 2020-04-09 DIAGNOSIS — M797 Fibromyalgia: Secondary | ICD-10-CM | POA: Diagnosis not present

## 2020-04-24 DIAGNOSIS — N184 Chronic kidney disease, stage 4 (severe): Secondary | ICD-10-CM | POA: Diagnosis not present

## 2020-05-01 DIAGNOSIS — D631 Anemia in chronic kidney disease: Secondary | ICD-10-CM | POA: Diagnosis not present

## 2020-05-01 DIAGNOSIS — N184 Chronic kidney disease, stage 4 (severe): Secondary | ICD-10-CM | POA: Diagnosis not present

## 2020-05-01 DIAGNOSIS — N2581 Secondary hyperparathyroidism of renal origin: Secondary | ICD-10-CM | POA: Diagnosis not present

## 2020-05-01 DIAGNOSIS — I10 Essential (primary) hypertension: Secondary | ICD-10-CM | POA: Diagnosis not present

## 2020-05-11 DIAGNOSIS — M797 Fibromyalgia: Secondary | ICD-10-CM | POA: Diagnosis not present

## 2020-05-11 DIAGNOSIS — N184 Chronic kidney disease, stage 4 (severe): Secondary | ICD-10-CM | POA: Diagnosis not present

## 2020-05-11 DIAGNOSIS — M25569 Pain in unspecified knee: Secondary | ICD-10-CM | POA: Diagnosis not present

## 2020-05-11 DIAGNOSIS — F419 Anxiety disorder, unspecified: Secondary | ICD-10-CM | POA: Diagnosis not present

## 2020-06-26 DIAGNOSIS — M170 Bilateral primary osteoarthritis of knee: Secondary | ICD-10-CM | POA: Diagnosis not present

## 2020-06-26 DIAGNOSIS — M1711 Unilateral primary osteoarthritis, right knee: Secondary | ICD-10-CM | POA: Diagnosis not present

## 2020-06-29 DIAGNOSIS — S93402A Sprain of unspecified ligament of left ankle, initial encounter: Secondary | ICD-10-CM | POA: Diagnosis not present

## 2020-07-02 DIAGNOSIS — M25572 Pain in left ankle and joints of left foot: Secondary | ICD-10-CM | POA: Diagnosis not present

## 2020-07-16 DIAGNOSIS — M25572 Pain in left ankle and joints of left foot: Secondary | ICD-10-CM | POA: Diagnosis not present

## 2020-07-17 ENCOUNTER — Encounter: Payer: Self-pay | Admitting: Family Medicine

## 2020-07-17 ENCOUNTER — Ambulatory Visit (INDEPENDENT_AMBULATORY_CARE_PROVIDER_SITE_OTHER): Payer: BC Managed Care – PPO | Admitting: Family Medicine

## 2020-07-17 ENCOUNTER — Other Ambulatory Visit: Payer: Self-pay

## 2020-07-17 VITALS — BP 117/73 | HR 105 | Temp 97.9°F | Ht 64.96 in | Wt 122.0 lb

## 2020-07-17 DIAGNOSIS — R413 Other amnesia: Secondary | ICD-10-CM | POA: Insufficient documentation

## 2020-07-17 DIAGNOSIS — M3500 Sicca syndrome, unspecified: Secondary | ICD-10-CM | POA: Insufficient documentation

## 2020-07-17 DIAGNOSIS — F411 Generalized anxiety disorder: Secondary | ICD-10-CM | POA: Diagnosis not present

## 2020-07-17 DIAGNOSIS — M3501 Sicca syndrome with keratoconjunctivitis: Secondary | ICD-10-CM

## 2020-07-17 DIAGNOSIS — K5909 Other constipation: Secondary | ICD-10-CM | POA: Insufficient documentation

## 2020-07-17 DIAGNOSIS — N184 Chronic kidney disease, stage 4 (severe): Secondary | ICD-10-CM

## 2020-07-17 DIAGNOSIS — F419 Anxiety disorder, unspecified: Secondary | ICD-10-CM | POA: Insufficient documentation

## 2020-07-17 DIAGNOSIS — Z862 Personal history of diseases of the blood and blood-forming organs and certain disorders involving the immune mechanism: Secondary | ICD-10-CM | POA: Insufficient documentation

## 2020-07-17 DIAGNOSIS — J309 Allergic rhinitis, unspecified: Secondary | ICD-10-CM | POA: Insufficient documentation

## 2020-07-17 DIAGNOSIS — M797 Fibromyalgia: Secondary | ICD-10-CM

## 2020-07-17 DIAGNOSIS — G43009 Migraine without aura, not intractable, without status migrainosus: Secondary | ICD-10-CM | POA: Insufficient documentation

## 2020-07-17 MED ORDER — PREGABALIN 50 MG PO CAPS: 50.0000 mg | ORAL_CAPSULE | Freq: Two times a day (BID) | ORAL | 1 refills | Status: DC

## 2020-07-17 NOTE — Patient Instructions (Signed)
Great to meet you today! Let's try adding lyrica back on.  I placed a referral to rheumatology.

## 2020-07-17 NOTE — Progress Notes (Signed)
[name] - 55 y.o. female MRN [id number]  Date of birth: 03/30/1966  Subjective Chief Complaint  Patient presents with  . Establish Care    HPI Katherine Contreras is a 55 y.o. female here today for initial visit to establish care.  She has a history of CKD4, Sjogren syndrome, fibromyalgia, ITP and anxiety.  Reports diagnosis of Sjogren and Fibromyalgia around 1985, started after having ITP and splenectomy.  She has tried several treatments over the years for her fibromyalgia including gabapentin (stopped due to nausea/dizziness), amitriptyline (wasn't very helpful), Lyrica (feels like it made her more irritable, but not sure) and most recently savella.  She was exercising more prior to pandemic and felt good doing this. She does feel like savella has helped but has worsened chronic dry mouth and eyes some.  She does take Cevimeline for these symptoms. She does not recall ever seeing a rheumatologist in the past but had requested a referral from her previous PCP.   She would be open to trying lyrica again.    She is followed by Dr. Posey Pronto for management of CKD.  CKD related to chronic NSAID use (Reports using advil PM daily for several years).   She is on transplant list.  Last EGFR of 18.    She has rx for alprazolam that she reports taking sparingly for severe anxiety symptoms.    ROS:  A comprehensive ROS was completed and negative except as noted per HPI  Allergies  Allergen Reactions  . Aspirin Other (See Comments)    SPLEEN REMOVED AND HISTORY OF PLATELET PROBLEM IN THE PAST Pt takes alleve if needed  . Gabapentin Nausea Only and Other (See Comments)    Dizziness, Syncope    Past Medical History:  Diagnosis Date  . Anxiety   . Blood dyscrasia    remission of ITP  . Chronic kidney disease    THIRD STAGE KIDNEY DISEASE - 30%  . Fibromyalgia   . Sjogren's syndrome ([place])   . TMJ click   . TMJ syndrome     Past Surgical History:  Procedure Laterality Date  .  APPENDECTOMY    . BREAST SURGERY     AUGMENTATION  . HYSTEROSCOPY WITH D & C  03/28/2012   Procedure: DILATATION AND CURETTAGE /HYSTEROSCOPY;  Surgeon: Thurnell Lose, MD;  Location: [facility];  Service: Gynecology;  Laterality: N/A;  . SPLEENECTOMY  12/05/83   had both (yes, she had two) spleens removed this date  . TONSILLECTOMY      Social History   Socioeconomic History  . Marital status: Divorced    Spouse name: Not on file  . Number of children: Not on file  . Years of education: Not on file  . Highest education level: Not on file  Occupational History  . Occupation: Musician  Tobacco Use  . Smoking status: Never Smoker  . Smokeless tobacco: Never Used  Vaping Use  . Vaping Use: Never used  Substance and Sexual Activity  . Alcohol use: Yes    Alcohol/week: 1.0 standard drink    Types: 1 Glasses of wine per week    Comment: occasionally  . Drug use: No  . Sexual activity: Not Currently    Partners: Male  Other Topics Concern  . Not on file  Social History Narrative  . Not on file   Social Determinants of Health   Financial Resource Strain: Not on file  Food Insecurity: Not on file  Transportation  Needs: Not on file  Physical Activity: Not on file  Stress: Not on file  Social Connections: Not on file    Family History  Problem Relation Age of Onset  . Hypertension Mother   . Heart attack Mother     Health Maintenance  Topic Date Due  . Hepatitis C Screening  Never done  . COVID-19 Vaccine (1) Never done  . HIV Screening  Never done  . PAP SMEAR-Modifier  Never done  . COLONOSCOPY (Pts 45-15yr Insurance coverage will need to be confirmed)  Never done  . MAMMOGRAM  Never done  . INFLUENZA VACCINE  11/16/2020  . TETANUS/TDAP  12/27/2021  . HPV VACCINES  Aged Out      ----------------------------------------------------------------------------------------------------------------------------------------------------------------------------------------------------------------- Physical Exam BP 117/73 (BP Location: Left Arm, Patient Position: Sitting, Cuff Size: Small)   Pulse (!) 105   Temp 97.9 F (36.6 C)   Ht 5' 4.96" (1.65 m)   Wt 122 lb (55.3 kg)   SpO2 100%   BMI 20.33 kg/m   Physical Exam Constitutional:      Appearance: Normal appearance.  HENT:     Head: Normocephalic and atraumatic.  Eyes:     General: No scleral icterus. Cardiovascular:     Rate and Rhythm: Normal rate and regular rhythm.  Pulmonary:     Effort: Pulmonary effort is normal.     Breath sounds: Normal breath sounds.  Musculoskeletal:        General: Tenderness (Multiple + tenderpoints. ) present.     Cervical back: Neck supple.  Skin:    General: Skin is warm and dry.  Neurological:     General: No focal deficit present.     Mental Status: She is alert.  Psychiatric:        Mood and Affect: Mood normal.        Behavior: Behavior normal.     ------------------------------------------------------------------------------------------------------------------------------------------------------------------------------------------------------------------- Assessment and Plan  Fibromyalgia She will continue Savella and will add lyrica back on at 584mBID based on renal function.   She will work on adding exercise back on as this was helpful in the past.   Anxiety disorder She has alprazolam as needed.  Recommend that she use this sparingly.    Chronic kidney disease, stage 4 (severe) (HCC) Related to chronic NSAID use.  Recent EGFR of 18.  She is followed by nephrology.   S[place]'s [place] Sjogren as well as fibromyalgia.  Referral made to rheumatology. Symptoms improved with civemiline.     Meds ordered this encounter   Medications  . pregabalin (LYRICA) 50 MG capsule    Sig: Take 1 capsule (50 mg total) by mouth 2 (two) times daily.    Dispense:  60 capsule    Refill:  1    Return in about 2 months (around 09/16/2020) for Fibro/Sjogrens.    This visit occurred during the SARS-CoV-2 public health emergency.  Safety protocols were in place, including screening questions prior to the visit, additional usage of staff PPE, and extensive cleaning of exam room while observing appropriate contact time as indicated for disinfecting solutions.

## 2020-07-19 NOTE — Assessment & Plan Note (Signed)
She will continue Savella and will add lyrica back on at '50mg'$  BID based on renal function.   She will work on adding exercise back on as this was helpful in the past.

## 2020-07-19 NOTE — Assessment & Plan Note (Signed)
Related to chronic NSAID use.  Recent EGFR of 18.  She is followed by nephrology.

## 2020-07-19 NOTE — Assessment & Plan Note (Signed)
She has alprazolam as needed.  Recommend that she use this sparingly.

## 2020-07-19 NOTE — Assessment & Plan Note (Signed)
She has history Sjogren as well as fibromyalgia.  Referral made to rheumatology. Symptoms improved with civemiline.

## 2020-07-24 ENCOUNTER — Telehealth: Payer: Self-pay

## 2020-07-24 NOTE — Telephone Encounter (Signed)
Pt lvm stating she will be unable to continue using Lyrica. She says the medication makes her feel drunk and she' unable to walk in a straight line.   Requesting an alternative medication.

## 2020-07-26 NOTE — Telephone Encounter (Signed)
Would she be interested in trying a reduced dose?

## 2020-07-27 NOTE — Telephone Encounter (Signed)
She would be willing to try a reduced dose.   ? One tab per day or lower

## 2020-07-27 NOTE — Telephone Encounter (Signed)
Lets try reducing to once per day, if still having dizziness we'll decrease further.

## 2020-07-28 NOTE — Telephone Encounter (Signed)
Patient agrees to decrease Lyrica to 1 tab daily. Understands if dizziness persists to contact the office for further decrease instructions.

## 2020-08-21 ENCOUNTER — Other Ambulatory Visit: Payer: Self-pay

## 2020-08-21 ENCOUNTER — Encounter: Payer: Self-pay | Admitting: Internal Medicine

## 2020-08-21 ENCOUNTER — Ambulatory Visit: Payer: BC Managed Care – PPO | Admitting: Internal Medicine

## 2020-08-21 VITALS — BP 99/66 | HR 98 | Resp 14 | Ht 65.0 in | Wt 120.0 lb

## 2020-08-21 DIAGNOSIS — M3500 Sicca syndrome, unspecified: Secondary | ICD-10-CM

## 2020-08-21 DIAGNOSIS — M797 Fibromyalgia: Secondary | ICD-10-CM

## 2020-08-21 DIAGNOSIS — N184 Chronic kidney disease, stage 4 (severe): Secondary | ICD-10-CM

## 2020-08-21 DIAGNOSIS — E559 Vitamin D deficiency, unspecified: Secondary | ICD-10-CM | POA: Diagnosis not present

## 2020-08-21 NOTE — Patient Instructions (Addendum)
I recommend checking out the [place] patient-centered guide for fibromyalgia and chronic pain management: https://www.olsen-oconnell.com/   Sjgren's Syndrome Sjgren's syndrome is a disease in which the body's disease-fighting system (immune system) attacks the glands that produce tears (lacrimal glands) and the glands that produce saliva (salivary glands). This makes the eyes and mouth very dry. Sjgren's syndrome is a long-term (chronic) disorder that has no cure. In some cases, it is linked to other disorders (rheumatic disorders), such as rheumatoid arthritis and systemic lupus erythematosus (SLE). It may affect other parts of the body, such as the:  Kidneys.  Blood vessels.  Joints.  Lungs.  Liver.  Pancreas.  Brain.  Nerves.  Spinal cord. What are the causes? The cause of this condition is not known. It may be passed along from parent to child (inherited), or it may be a symptom of a rheumatic disorder. What increases the risk? This condition is more likely to develop in:  Women.  People who are 21-86 years old.  People who have recently had a viral infection or currently have a viral infection. What are the signs or symptoms? The main symptoms of this condition are:  Dry mouth. This may include: ? A chalky feeling. ? Difficulty swallowing, speaking, or tasting. ? Frequent cavities in the teeth. ? Frequent mouth infections.  Dry eyes. This may include: ? Burning, redness, and itching. ? Blurry vision. ? Light sensitivity. Other symptoms may include:  Dryness of the skin and the inside of the nose.  Eyelid infections.  Vaginal dryness, if this applies.  Joint pain and stiffness.  Muscle pain and stiffness. How is this diagnosed? This condition is diagnosed based on:  Your symptoms.  Your medical history.  A physical exam of your eyes and mouth.  Tests, including: ? A Schirmer test. This tests your tear production. ? An eye exam that  is done with a magnifying device (slit-lamp exam). ? An eye test that temporarily stains your eye with dye. This shows the extent of eye damage. ? Tests to check your salivary gland function. ? Biopsy. This is a removal of part of a salivary gland from inside your lower lip to be studied under a microscope. ? Chest X-rays. ? Blood tests. ? Urine tests. How is this treated? There is no cure for this condition, but treatment can help you manage your symptoms. This condition may be treated with:  Moisture replacement therapies to help relieve dryness in your skin, mouth, and eyes.  Medicines to help relieve pain and stiffness.  Medicines to help relieve inflammation in your body (corticosteroids). These are usually for severe cases.  Medicines to help reduce the activity of your immune system (immunosuppressants).  Surgery or insertion of plugs to close the lacrimal glands (punctal occlusion). This helps keep more natural tears in your eyes. Follow these instructions at home: Eye care  Use eye drops as told by your health care provider.  Protect your eyes from the sun and wind with sunglasses or glasses.  Blink at least 5-6 times a minute.  Maintain properly humidified air. You may want to use a humidifier at home.  Avoid smoke.   Mouth care  Brush your teeth and floss after every meal.  Chew sugar-free gum or suck on hard candy. This may help to relieve dry mouth.  Use antimicrobial mouthwash daily.  Take frequent sips of water or sugar-free drinks.  Use saliva substitutes or lip balm as told by your health care provider.  Schedule  and attend dentist visits every 6 months. General instructions  Take over-the-counter and prescription medicines only as told by your health care provider.  Drink enough fluid to keep your urine pale yellow.  Keep all follow-up visits as told by your health care provider. This is important.   Contact a health care provider if:  You have a  fever.  You have night sweats.  You are always tired.  You have unexplained weight loss.  You develop itchy skin.  You have red patches on your skin.  You have a lump or swelling on your neck. Summary  Sjgren's syndrome is a disease in which the body's disease-fighting system attacks the glands that produce tears and the glands that produce saliva.  This condition makes the eyes and mouth very dry.  Sjgren's syndrome is a long-term (chronic) disorder that has no cure.  The cause of this condition is not known.  There is no cure for this condition, but treatment can help you manage your symptoms.   Myofascial Pain Syndrome and Fibromyalgia Myofascial pain syndrome and fibromyalgia are both pain disorders. This pain may be felt mainly in your muscles.  Myofascial pain syndrome: ? Always has tender points in the muscle that will cause pain when pressed (trigger points). The pain may come and go. ? Usually affects your neck, upper back, and shoulder areas. The pain often radiates into your arms and hands.  Fibromyalgia: ? Has muscle pains and tenderness that come and go. ? Is often associated with fatigue and sleep problems. ? Has trigger points. ? Tends to be long-lasting (chronic), but is not life-threatening. Fibromyalgia and myofascial pain syndrome are not the same. However, they often occur together. If you have both conditions, each can make the other worse. Both are common and can cause enough pain and fatigue to make day-to-day activities difficult. Both can be hard to diagnose because their symptoms are common in many other conditions. What are the causes? The exact causes of these conditions are not known. What increases the risk? You are more likely to develop this condition if:  You have a family history of the condition.  You have certain triggers, such as: ? Spine disorders. ? An injury (trauma) or other physical stressors. ? Being under a lot of  stress. ? Medical conditions such as osteoarthritis, rheumatoid arthritis, or lupus. What are the signs or symptoms? Fibromyalgia The main symptom of fibromyalgia is widespread pain and tenderness in your muscles. Pain is sometimes described as stabbing, shooting, or burning. You may also have:  Tingling or numbness.  Sleep problems and fatigue.  Problems with attention and concentration (fibro fog). Other symptoms may include:  Bowel and bladder problems.  Headaches.  Visual problems.  Problems with odors and noises.  Depression or mood changes.  Painful menstrual periods (dysmenorrhea).  Dry skin or eyes. These symptoms can vary over time. Myofascial pain syndrome Symptoms of myofascial pain syndrome include:  Tight, ropy bands of muscle.  Uncomfortable sensations in muscle areas. These may include aching, cramping, burning, numbness, tingling, and weakness.  Difficulty moving certain parts of the body freely (poor range of motion). How is this diagnosed? This condition may be diagnosed by your symptoms and medical history. You will also have a physical exam. In general:  Fibromyalgia is diagnosed if you have pain, fatigue, and other symptoms for more than 3 months, and symptoms cannot be explained by another condition.  Myofascial pain syndrome is diagnosed if you have trigger points in  your muscles, and those trigger points are tender and cause pain elsewhere in your body (referred pain). How is this treated? Treatment for these conditions depends on the type that you have.  For fibromyalgia: ? Pain medicines, such as NSAIDs. ? Medicines for treating depression. ? Medicines for treating seizures. ? Medicines that relax the muscles.  For myofascial pain: ? Pain medicines, such as NSAIDs. ? Cooling and stretching of muscles. ? Trigger point injections. ? Sound wave (ultrasound) treatments to stimulate muscles. Treating these conditions often requires a team  of health care providers. These may include:  Your primary care provider.  Physical therapist.  Complementary health care providers, such as massage therapists or acupuncturists.  Psychiatrist for cognitive behavioral therapy.   Follow these instructions at home: Medicines  Take over-the-counter and prescription medicines only as told by your health care provider.  Do not drive or use heavy machinery while taking prescription pain medicine.  If you are taking prescription pain medicine, take actions to prevent or treat constipation. Your health care provider may recommend that you: ? Drink enough fluid to keep your urine pale yellow. ? Eat foods that are high in fiber, such as fresh fruits and vegetables, whole grains, and beans. ? Limit foods that are high in fat and processed sugars, such as fried or sweet foods. ? Take an over-the-counter or prescription medicine for constipation. Lifestyle  Exercise as directed by your health care provider or physical therapist.  Practice relaxation techniques to control your stress. You may want to try: ? Biofeedback. ? Visual imagery. ? Hypnosis. ? Muscle relaxation. ? Yoga. ? Meditation.  Maintain a healthy lifestyle. This includes eating a healthy diet and getting enough sleep.  Do not use any products that contain nicotine or tobacco, such as cigarettes and e-cigarettes. If you need help quitting, ask your health care provider.   General instructions  Talk to your health care provider about complementary treatments, such as acupuncture or massage.  Consider joining a support group with others who are diagnosed with this condition.  Do not do activities that stress or strain your muscles. This includes repetitive motions and heavy lifting.  Keep all follow-up visits as told by your health care provider. This is important. Where to find more information  National Fibromyalgia Association: www.fmaware.[place]:  www.arthritis.org  American Chronic Pain Association: www.theacpa.org Contact a health care provider if:  You have new symptoms.  Your symptoms get worse or your pain is severe.  You have side effects from your medicines.  You have trouble sleeping.  Your condition is causing depression or anxiety. Summary  Myofascial pain syndrome and fibromyalgia are pain disorders.  Myofascial pain syndrome has tender points in the muscle that will cause pain when pressed (trigger points). Fibromyalgia also has muscle pains and tenderness that come and go, but this condition is often associated with fatigue and sleep disturbances.  Fibromyalgia and myofascial pain syndrome are not the same but often occur together, causing pain and fatigue that make day-to-day activities difficult.  Treatment for fibromyalgia includes taking medicines to relax the muscles and medicines for pain, depression, or seizures. Treatment for myofascial pain syndrome includes taking medicines for pain, cooling and stretching of muscles, and injecting medicines into trigger points.  Follow your health care provider's instructions for taking medicines and maintaining a healthy lifestyle.

## 2020-08-21 NOTE — Progress Notes (Signed)
Office Visit Note  Patient: Katherine Contreras             Date of Birth: 20-Mar-1966           MRN: [id number]             PCP: Katherine Nutting, DO Referring: Katherine Nutting, DO Visit Date: 08/21/2020 Occupation: [employer], Engineer, agricultural work  Subjective:  New Patient (Initial Visit) (Fibromyalgia, patient has not had COVID vaccines)   History of Present Illness: [name] is a 55 y.o. female here for evaluation of sjogren's syndrome and fibromyalgia.  She states original diagnosis of Sjogren's syndrome was around 1985 with rheumatology at the time she mostly recalls the fibromyalgia symptoms as predominant but over subsequent years has noticed worsening symptoms with chronic dry eyes and dry mouth.  She has been treated with cevimeline with good improvement in her oral dryness.  She never developed any particularly inflammatory joint changes however chronic fibromyalgia pain is also worsened with some osteoarthritis especially in the right knee.  She has been treated with multiple drugs including Elavil, Lyrica, Cymbalta, and currently takes [facility] which he feels like is helpful but no longer fully controlling her symptoms.  She was recently tried with addition of Lyrica but made her feel like a drunk sensation.  She works with a Clinical research associate and exercising in the pool which have been very beneficial for her symptoms.  Current problems are primarily the sicca syndrome, chronic fatigue, chronic constipation, plus or diffuse joint pain and body aches.  She has advanced chronic kidney disease now estimated as stage IV thought to be contributed largely to chronic NSAID use and sees Dr. Graylon Gunning at [facility] for this.  She reports she has been on a kidney transplant list for about 4 years.  Activities of Daily Living:  Patient reports morning stiffness for 24 hour.   Patient Denies nocturnal pain.  Difficulty dressing/grooming: Denies Difficulty climbing stairs: Denies Difficulty getting  out of chair: Denies Difficulty using hands for taps, buttons, cutlery, and/or writing: Denies  Review of Systems  Constitutional: Positive for fatigue.  HENT: Positive for mouth dryness.   Eyes: Positive for dryness.  Respiratory: Negative for shortness of breath.   Cardiovascular: Negative for swelling in legs/feet.  Gastrointestinal: Positive for constipation.  Endocrine: Negative for excessive thirst.  Genitourinary: Negative for difficulty urinating.  Musculoskeletal: Positive for arthralgias, joint pain, muscle weakness, morning stiffness and muscle tenderness.  Skin: Negative for rash.  Allergic/Immunologic: Negative for susceptible to infections.  Neurological: Positive for light-headedness and numbness.  Hematological: Negative for bruising/bleeding tendency.  Psychiatric/Behavioral: Negative for sleep disturbance.    PMFS History:  Patient Active Problem List   Diagnosis Date Noted  . Vitamin D deficiency 08/21/2020  . Sjogren's syndrome ([place]) 07/17/2020  . Personal history of diseases of the blood and blood-forming organs and certain disorders involving the immune mechanism 07/17/2020  . Fibromyalgia 07/17/2020  . Chronic kidney disease, stage 4 (severe) ([place]) 07/17/2020  . Chronic constipation 07/17/2020  . Anxiety disorder 07/17/2020  . Amnesia 07/17/2020  . Allergic rhinitis 07/17/2020    Past Medical History:  Diagnosis Date  . Anxiety   . Blood dyscrasia    remission of ITP  . Chronic kidney disease    THIRD STAGE KIDNEY DISEASE - 30%  . Fibromyalgia   . Sjogren's syndrome ([place])   . TMJ click   . TMJ syndrome     Family History  Problem Relation Age of Onset  .  Hypertension Mother   . Heart attack Mother   . Bone cancer Father   . Multiple myeloma Father   . Kidney disease Sister    Past Surgical History:  Procedure Laterality Date  . APPENDECTOMY    . BREAST SURGERY     AUGMENTATION  . HYSTEROSCOPY WITH D & C  03/28/2012   Procedure:  DILATATION AND CURETTAGE /HYSTEROSCOPY;  Surgeon: Thurnell Lose, MD;  Location: [facility];  Service: Gynecology;  Laterality: N/A;  . SPLEENECTOMY  12/05/83   had both (yes, she had two) spleens removed this date  . TONSILLECTOMY     Social History   Social History Narrative  . Not on file   Immunization History  Administered Date(s) Administered  . Influenza Split 12/29/2008, 01/26/2011, 12/28/2011, 01/01/2013, 10/21/2015, 01/05/2017  . Influenza,inj,Quad PF,6+ Mos 12/31/2015, 01/17/2019  . Influenza,inj,quad, With Preservative 01/28/2014, 12/30/2014  . Td 09/02/2003  . Tdap 12/28/2011     Objective: Vital Signs: BP 99/66 (BP Location: Right Arm, Patient Position: Sitting, Cuff Size: Normal)   Pulse 98   Resp 14   Ht _0  (1.651 m)   Wt 120 lb (54.4 kg)   BMI 19.97 kg/m    Physical Exam HENT:     Right Ear: External ear normal.     Left Ear: External ear normal.     Mouth/Throat:     Mouth: Mucous membranes are moist.     Pharynx: Oropharynx is clear.  Eyes:     Conjunctiva/sclera: Conjunctivae normal.  Cardiovascular:     Rate and Rhythm: Normal rate and regular rhythm.  Pulmonary:     Effort: Pulmonary effort is normal.     Breath sounds: Normal breath sounds.  Skin:    General: Skin is warm and dry.     Findings: No rash.     Comments: Normal nailfold capillaroscopy  Neurological:     General: No focal deficit present.     Mental Status: She is alert.  Psychiatric:        Mood and Affect: Mood normal.    Musculoskeletal Exam:  Neck full ROM pain at neck base provoked with lateral rotation worse on right Shoulders full ROM no tenderness or swelling Elbows full ROM no tenderness or swelling Wrists full ROM no tenderness or swelling Fingers full ROM no tenderness or swelling, no significant nodules Knees full ROM, right knee irregular bony prominence on anterior surface of patella, patellofemoral crepitus, medial and lateral joint line tenderness to pressure  with normal range of motion and no effusions Ankles full ROM no tenderness or swelling MTPs negative squeeze test bunions present on first MTPs bilaterally   Investigation: No additional findings.  Imaging: No results found.  Recent Labs: Lab Results  Component Value Date   WBC 11.7 (H) 05/27/2016   HGB 10.8 (L) 05/27/2016   PLT 338 05/27/2016   NA 138 05/27/2016   K 3.3 (L) 05/27/2016   CL 109 05/27/2016   CO2 23 05/27/2016   GLUCOSE 155 (H) 05/27/2016   BUN 24 (H) 05/27/2016   CREATININE 2.36 (H) 05/27/2016   BILITOT 0.5 05/27/2016   ALKPHOS 100 05/27/2016   AST 30 05/27/2016   ALT 29 05/27/2016   PROT 7.7 05/27/2016   ALBUMIN 3.9 05/27/2016   CALCIUM 8.4 (L) 05/27/2016   GFRAA 26 (L) 05/27/2016    Speciality Comments: No specialty comments available.  Procedures:  No procedures performed Allergies: Aspirin, Gabapentin, and Lyrica [pregabalin]   Assessment / Plan:  Visit Diagnoses: Sjogren's syndrome, with unspecified organ involvement ([place]) - Plan: Sedimentation rate, Sjogrens syndrome-A extractable nuclear antibody, Sjogrens syndrome-B extractable nuclear antibody, C3 and C4, Rheumatoid factor  History of Sjogren's syndrome with no recent serology and no chronic immunosuppressive medication history.  We will check labs today SSB, SSA, rheumatoid factor, sed rate, complement levels for both serologic markers and prognostic markers.  Not starting any new medication treatment at this time will require caution on new drug use with severe renal impairment.  Fibromyalgia  Most of the active symptoms sound consistent with fibromyalgia syndrome she currently takes [place] for this and is being physically active working with a trainer and exercising regularly.  I recommend her to review the [place] fiber guide website on patient self-care for fibromyalgia.  She did not tolerate adding Lyrica to her existing regimen we will hold off on trial of additional  agent at this time.  Chronic kidney disease, stage 4 (severe) ([facility])   She is established with [facility], she reports having had a previous kidney biopsy and it was not felt to represent renal involvement of systemic connective tissue disease.  Vitamin D deficiency - Plan: VITAMIN D 25 Hydroxy (Vit-D Deficiency, Fractures)  She has borderline low weight, chronic connective tissue disease, and some evidence of osteoarthritis we will check vitamin D test for this today with her increased risk factors for osteoporosis.  Orders: Orders Placed This Encounter  Procedures  . Sedimentation rate  . Sjogrens syndrome-A extractable nuclear antibody  . Sjogrens syndrome-B extractable nuclear antibody  . C3 and C4  . Rheumatoid factor  . VITAMIN D 25 Hydroxy (Vit-D Deficiency, Fractures)   No orders of the defined types were placed in this encounter.   Follow-Up Instructions: No follow-ups on file.   Collier Salina, MD  Note - This record has been created using [employer].  Chart creation errors have been sought, but may not always  have been located. Such creation errors do not reflect on  the standard of medical care.

## 2020-08-24 ENCOUNTER — Telehealth: Payer: Self-pay | Admitting: Radiology

## 2020-08-24 LAB — C3 AND C4
C3 Complement: 97 mg/dL (ref 83–193)
C4 Complement: 30 mg/dL (ref 15–57)

## 2020-08-24 LAB — SJOGRENS SYNDROME-A EXTRACTABLE NUCLEAR ANTIBODY: SSA (Ro) (ENA) Antibody, IgG: >8 AI — AB

## 2020-08-24 LAB — RHEUMATOID FACTOR: Rheumatoid fact SerPl-aCnc: <14 IU/mL (ref <14)

## 2020-08-24 LAB — SJOGRENS SYNDROME-B EXTRACTABLE NUCLEAR ANTIBODY: SSB (La) (ENA) Antibody, IgG: <1 AI

## 2020-08-24 LAB — VITAMIN D 25 HYDROXY (VIT D DEFICIENCY, FRACTURES): Vit D, 25-Hydroxy: 60 ng/mL (ref 30–100)

## 2020-08-24 LAB — SEDIMENTATION RATE: Sed Rate: 34 mm/h — ABNORMAL HIGH (ref 0–30)

## 2020-08-24 NOTE — Telephone Encounter (Signed)
Ms. Hauver left a voicemail for me returning a call from the office, did you try getting in touch with her Friday? If so, she can be reach at [phone].

## 2020-08-25 NOTE — Telephone Encounter (Signed)
Lab results show highly positive antibody level for sjogren's syndrome. The sedimentation rate which is a marker for inflammation is mildly elevated currently. This may indicate there is some active inflammation contributing to her symptoms. We should probably schedule a return visit to discuss options for this, since her kidney function increases risks for some medications.

## 2020-08-25 NOTE — Telephone Encounter (Signed)
Spoke with patient, told her we are unsure of who tried to contact her- patient would like to know test results/how we're moving forward. Advised patient I would reach out to Dr. Benjamine Mola regarding results.

## 2020-08-26 ENCOUNTER — Encounter: Payer: Self-pay | Admitting: Internal Medicine

## 2020-08-26 ENCOUNTER — Ambulatory Visit (INDEPENDENT_AMBULATORY_CARE_PROVIDER_SITE_OTHER): Payer: BC Managed Care – PPO | Admitting: Internal Medicine

## 2020-08-26 ENCOUNTER — Other Ambulatory Visit: Payer: Self-pay

## 2020-08-26 VITALS — BP 118/80 | HR 97 | Ht 65.0 in | Wt 121.8 lb

## 2020-08-26 DIAGNOSIS — K5909 Other constipation: Secondary | ICD-10-CM | POA: Diagnosis not present

## 2020-08-26 DIAGNOSIS — M797 Fibromyalgia: Secondary | ICD-10-CM

## 2020-08-26 DIAGNOSIS — N184 Chronic kidney disease, stage 4 (severe): Secondary | ICD-10-CM | POA: Diagnosis not present

## 2020-08-26 DIAGNOSIS — M3500 Sicca syndrome, unspecified: Secondary | ICD-10-CM | POA: Diagnosis not present

## 2020-08-26 LAB — BASIC METABOLIC PANEL
BUN: 40 — AB (ref 4–21)
BUN: 40 — AB (ref 4–21)
CO2: 18 (ref 13–22)
Chloride: 111 — AB (ref 99–108)
Creatinine: 4.1 — AB (ref 0.5–1.1)
Glucose: 95
Potassium: 3.5 (ref 3.4–5.3)
Sodium: 140 (ref 137–147)

## 2020-08-26 LAB — CBC AND DIFFERENTIAL
HCT: 27 — AB (ref 36–46)
Hemoglobin: 8.9 — AB (ref 12.0–16.0)
Neutrophils Absolute: 3.7
Platelets: 343 (ref 150–399)
WBC: 8.8

## 2020-08-26 LAB — CBC: RBC: 2.98 — AB (ref 3.87–5.11)

## 2020-08-26 LAB — VITAMIN D 25 HYDROXY (VIT D DEFICIENCY, FRACTURES): Vit D, 25-Hydroxy: 39.8

## 2020-08-26 LAB — COMPREHENSIVE METABOLIC PANEL
Albumin: 4.3 (ref 3.5–5.0)
Calcium: 9.2 (ref 8.7–10.7)

## 2020-08-26 MED ORDER — HYDROXYCHLOROQUINE SULFATE 200 MG PO TABS: ORAL_TABLET | ORAL | 0 refills | Status: DC

## 2020-08-26 NOTE — Patient Instructions (Addendum)
I recommend starting the hydroxychloroquine 200 mg tablets daily Monday-Friday for sjogren's syndrome inflammation and joint pain.  Bisacodyl tablets and capsules What is this medicine? BISACODYL (bis a KOE dill) is a laxative. This medicine is used to relieve constipation. It may also be used to empty and prepare the bowel for surgery or examination. This medicine may be used for other purposes; ask your health care provider or pharmacist if you have questions. COMMON BRAND NAME(S): Alophen, Bisac-Evac, Corrective Laxative for Women, Correctol, Dacodyl, Doxidan, Dulcolax, Ex-Lax Ultra, Feen-A-Mint, Fematrol, Femilax, Fleet, Reliable Gentle Laxative, Veracolate What should I tell my health care provider before I take this medicine? They need to know if you have any of these conditions  appendicitis  persistent constipation  stomach pain or blockage  ulcerative colitis or other bowel disease  an unusual or allergic reaction to bisacodyl, other medicines, foods, dyes, or preservatives  pregnant or trying to get pregnant How should I use this medicine? Take this medicine by mouth with a glass of water. Follow the directions on the prescription label. Swallow the tablets whole. Do not crush or chew. Do not take this medicine more often than directed. Talk to your pediatrician regarding the use of this medicine in children. While this medicine may be used in children as young as 6 years for selected conditions, precautions do apply. Overdosage: If you think you have taken too much of this medicine contact a poison control center or emergency room at once. NOTE: This medicine is only for you. Do not share this medicine with others. What if I miss a dose? This does not apply. This medicine is not for regular use, and should only be used as needed. What may interact with this medicine?  antacids  h2-blockers like cimetidine, famotidine, nizatidine, and ranitidine  proton pump inhibitors  like esomeprazole, omeprazole, pantoprazole, and rabeprazole This list may not describe all possible interactions. Give your health care provider a list of all the medicines, herbs, non-prescription drugs, or dietary supplements you use. Also tell them if you smoke, drink alcohol, or use illegal drugs. Some items may interact with your medicine. What should I watch for while using this medicine? Do not use this medicine for longer than directed by your doctor or health care professional. This medicine can be habit-forming. Long-term use can make your body depend on the laxative for regular bowel movements, damage the bowel, cause malnutrition, and problems with the amounts of water and salts in your body. If your constipation keeps returning, check with your doctor or health care professional. Do not take this medicine within 1 hour of taking antacids or eating dairy products like milk or yogurt. These items can destroy the protective coating on the tablets and increase stomach upset and cramps. If you do not have a bowel movement within 12 hours after using this medicine or you experience rectal bleeding, contact your doctor or health care professional. These may be signs of a more serious condition. What side effects may I notice from receiving this medicine? Side effects that you should report to your doctor or health care professional as soon as possible:  diarrhea  muscle weakness  nausea, vomiting  unusual weight loss Side effects that usually do not require medical attention (report to your doctor or health care professional if they continue or are bothersome):  bloating  discolored urine  lower stomach discomfort or cramps  rectal itching, burning, or swelling This list may not describe all possible side effects. Call your  doctor for medical advice about side effects. You may report side effects to FDA at 1-800-FDA-1088. Where should I keep my medicine? Keep out of the reach of  children. Store at room temperature below 25 degrees C (77 degrees F). Protect from moisture. Throw away any unused medicine after the expiration date.  Senna tablets or capsules What is this medicine? SENNA (SEN uh) is a laxative. This medicine is used to relieve constipation. It may also be used to empty and prepare the bowel for surgery or examination. This medicine may be used for other purposes; ask your health care provider or pharmacist if you have questions. COMMON BRAND NAME(S): [place]ught, Ex-Lax, [place], Lax-Pills, Perdiem, Plus PHARMA, [place], [place], [place], [place], [place], [place], [place], Senokot, Senokot Extra Strength, Senokot Xtra, SenoSol, SenoSol-X, Uni-Cenna What should I tell my health care provider before I take this medicine? They need to know if you have any of these conditions:  blockage in your bowel  inflammatory bowel disease  stomach or intestine problems  sudden change in bowel habits lasting more than 2 weeks  vomiting  an unusual or allergic reaction to senna, other medicines, foods, dyes, or preservatives  pregnant or trying to get pregnant  breast-feeding How should I use this medicine? Take this medicine by mouth with a full glass of water. Follow the directions on the product label. Take exactly as directed. Do not take your medicine more often than directed. Talk to your pediatrician regarding the use of this medicine in children. While this medicine may be prescribed for children as young as 2 years for selected conditions, precautions do apply. Overdosage: If you think you have taken too much of this medicine contact a poison control center or emergency room at once. NOTE: This medicine is only for you. Do not share this medicine with others. What if I miss a dose? If you miss a dose, take it as soon as you can. If it is almost time for your next dose, take only that dose. Do not take double or extra doses. What may  interact with this medicine? Interactions are not expected. Do not use any other laxative products without talking to your healthcare professional. This list may not describe all possible interactions. Give your health care provider a list of all the medicines, herbs, non-prescription drugs, or dietary supplements you use. Also tell them if you smoke, drink alcohol, or use illegal drugs. Some items may interact with your medicine. What should I watch for while using this medicine? Do not use for more than 1 week unless otherwise directed by your doctor or health care professional. Stop using this medicine and contact your doctor or health care professional if you have rectal bleeding or do not have a bowel movement after use. These could be signs of a more serious condition. What side effects may I notice from receiving this medicine? Side effects that you should report to your doctor or health care professional as soon as possible:  allergic reactions like skin rash, itching or hives, swelling of the face, lips, or tongue  bloody or black, tarry stools  breathing problems  muscle weakness  stomach pain  unusually weak or tired  vomiting Side effects that usually do not require medical attention (report to your doctor or health care professional if they continue or are bothersome):  discolored urine (red or orange)  gas  upset stomach This list may not describe all possible side effects. Call your doctor for medical advice about side  effects. You may report side effects to FDA at 1-800-FDA-1088. Where should I keep my medicine? Keep out of the reach of children. Store at room temperature between 15 and 30 degrees C (59 and 86 degrees F). Keep container tightly closed. Throw away any unused medicine after the expiration date.  Polyethylene Glycol; Electrolytes oral solution What is this medicine? POLYETHYLENE GLYCOL; ELECTROLYTES (pol ee ETH i leen GLYE col; i LEK truh lahyts) is a  laxative. It is used to clean out the bowel before a colonoscopy. This medicine may be used for other purposes; ask your health care provider or pharmacist if you have questions. COMMON BRAND NAME(S): Colyte, GaviLyte-C, GaviLyte-G, GaviLyte-N, GoLYTELY, NuLYTELY, OCL, Suclear, TriLyte What should I tell my health care provider before I take this medicine? They need to know if you have any of these conditions:  any chronic disease of the intestine, stomach, or throat  bloating or stomach pain  dehydration  difficulty swallowing  heart disease  kidney disease  low levels of sodium in the blood  seizures  an unusual or allergic reaction to polyethylene glycol, other medicines, dyes, or preservatives  pregnant or trying to get pregnant  breast-feeding How should I use this medicine? Take this medicine by mouth. Before using this medicine you or your pharmacist must mix this medicine with the amount of water indicated on the package label. Do not combine with other liquids and/or starch-based thickeners (e.g., flour, cornstarch, arrowroot, tapioca, xanthan gum). Chill solution before using to improve taste. Shake well before each dose. Your doctor will tell you what time to start this medicine. Take exactly as directed. You will usually have the first bowel movement about 1 hour after you begin drinking the solution. After that, you will have frequent watery bowel movements. You will need to follow a special diet before your procedure. Talk to your doctor. Do not eat any solid foods for 3 to 4 hours before taking this medicine. A special MedGuide will be given to you by the pharmacist with each prescription and refill. Be sure to read this information carefully each time. Talk to your health care provider regarding the use of this medicine in children. Special care may be needed. Overdosage: If you think you have taken too much of this medicine contact a poison control center or emergency  room at once. NOTE: This medicine is only for you. Do not share this medicine with others. What if I miss a dose? Talk to your health care provider if you are not able to complete the bowel preparation as advised. What may interact with this medicine? Do not take any other medicine by mouth starting 1 hour before you take this medicine. Talk to your doctor about when to take your other medicines. This medicine may interact with the following medications:  certain medicines for blood pressure, heart disease, irregular heart beat  certain medicines for kidney disease  certain medicines for seizures like carbamazepine, phenobarbital, phenytoin  diuretics  laxatives  NSAIDS, medicines for pain and inflammation, like ibuprofen or naproxen This list may not describe all possible interactions. Give your health care provider a list of all the medicines, herbs, non-prescription drugs, or dietary supplements you use. Also tell them if you smoke, drink alcohol, or use illegal drugs. Some items may interact with your medicine. What should I watch for while using this medicine? Contact your doctor or health care provider if you have any concerns about your bowel prep. What side effects may I notice  from receiving this medicine? Side effects that you should report to your doctor or health care professional as soon as possible:  allergic reactions like skin rash, itching or hives, swelling of the face, lips, or tongue  chest pain  dizziness  fast, irregular heartbeat  headache  seizures  trouble breathing  trouble passing urine or change in the amount of urine  vomiting Side effects that usually do not require medical attention (report to your doctor or health care professional if they continue or are bothersome):  anal irritation  bloating or stomach pain  gas  nausea This list may not describe all possible side effects. Call your doctor for medical advice about side effects. You  may report side effects to FDA at 1-800-FDA-1088. Where should I keep my medicine? Keep out of the reach of children and pets. Store the solution in the refrigerator after mixing to improve the taste. Do not freeze. Get rid of any unused medicine 48 hours after mixing.  Docusate capsules What is this medicine? DOCUSATE (doc CUE sayt) is stool softener. It helps prevent constipation and straining or discomfort associated with hard or dry stools. This medicine may be used for other purposes; ask your health care provider or pharmacist if you have questions. COMMON BRAND NAME(S): BeneHealth Stool Softner, Colace, Colace Clear, Correctol, D.O.S., DC, Doc-Q-Lace, DocuLace, Docusoft S, DOK, DOK Extra Strength, Dulcolax, Genasoft, Kao-Tin, Kaopectate Liqui-Gels, Phillips Stool Softener, Plus PHARMA, Stool Softener, Stool Softner DC, Sulfolax, Sur-Q-Lax, Surfak, Uni-Ease What should I tell my health care provider before I take this medicine? They need to know if you have any of these conditions:  nausea or vomiting  severe constipation  stomach pain  sudden change in bowel habit lasting more than 2 weeks  an unusual or allergic reaction to docusate, other medicines, foods, dyes, or preservatives  pregnant or trying to get pregnant  breast-feeding How should I use this medicine? Take this medicine by mouth with a glass of water. Follow the directions on the label. Take your doses at regular intervals. Do not take your medicine more often than directed. Talk to your pediatrician regarding the use of this medicine in children. While this medicine may be prescribed for children as young as 2 years for selected conditions, precautions do apply. Overdosage: If you think you have taken too much of this medicine contact a poison control center or emergency room at once. NOTE: This medicine is only for you. Do not share this medicine with others. What if I miss a dose? If you miss a dose, take it as  soon as you can. If it is almost time for your next dose, take only that dose. Do not take double or extra doses. What may interact with this medicine?  mineral oil This list may not describe all possible interactions. Give your health care provider a list of all the medicines, herbs, non-prescription drugs, or dietary supplements you use. Also tell them if you smoke, drink alcohol, or use illegal drugs. Some items may interact with your medicine. What should I watch for while using this medicine? Do not use for more than one week without advice from your doctor or health care professional. If your constipation returns, check with your doctor or health care professional. Drink plenty of water while taking this medicine. Drinking water helps decrease constipation. Stop using this medicine and contact your doctor or health care professional if you experience any rectal bleeding or do not have a bowel movement after use. These  could be signs of a more serious condition. What side effects may I notice from receiving this medicine? Side effects that you should report to your doctor or health care professional as soon as possible:  allergic reactions like skin rash, itching or hives, swelling of the face, lips, or tongue Side effects that usually do not require medical attention (report to your doctor or health care professional if they continue or are bothersome):  diarrhea  stomach cramps  throat irritation This list may not describe all possible side effects. Call your doctor for medical advice about side effects. You may report side effects to FDA at 1-800-FDA-1088. Where should I keep my medicine? Keep out of the reach of children. Store at room temperature between 15 and 30 degrees C (59 and 86 degrees F). Throw away any unused medicine after the expiration date. NOTE: This sheet is a summary. It may not cover all possible information. If you have questions about this medicine, talk to your  doctor, pharmacist, or health care provider.  2021 Elsevier/Gold Standard (2007-07-26 15:56:49)

## 2020-08-26 NOTE — Progress Notes (Signed)
Office Visit Note  Patient: Katherine Contreras             Date of Birth: [birth year]           MRN: [id number]             PCP: Luetta Nutting, DO Referring: Luetta Nutting, DO Visit Date: 08/26/2020  Subjective:  Follow-up (Patient is here to discuss treatment options. )   History of Present Illness: Katherine Contreras is a 55 y.o. female with sjogren syndrome and fibromyalgia here for follow up and treatment plans discussion.  She was having primary issues with severe fatigue some joint pains dryness of the eyes and mouth and chronic constipation.  Labs showed positive serology for the Sjogren's syndrome and mildly elevated inflammatory markers.  She has had no significant change in symptoms since the last visit.  Review of Systems  Constitutional: Positive for fatigue.  HENT: Positive for mouth dryness and nose dryness. Negative for mouth sores.   Eyes: Positive for dryness. Negative for pain, itching and visual disturbance.  Respiratory: Positive for shortness of breath and difficulty breathing. Negative for cough and hemoptysis.   Cardiovascular: Positive for chest pain and palpitations. Negative for swelling in legs/feet.  Gastrointestinal: Positive for constipation. Negative for abdominal pain, blood in stool and diarrhea.  Endocrine: Negative for increased urination.  Genitourinary: Negative for painful urination.  Musculoskeletal: Positive for arthralgias, joint pain, myalgias, muscle weakness, morning stiffness, muscle tenderness and myalgias. Negative for joint swelling.  Skin: Negative for color change, rash and redness.  Allergic/Immunologic: Negative for susceptible to infections.  Neurological: Positive for dizziness, numbness and weakness. Negative for headaches and memory loss.  Hematological: Negative for swollen glands.  Psychiatric/Behavioral: Positive for confusion. Negative for sleep disturbance.    PMFS History:  Patient Active Problem List   Diagnosis Date Noted  .  Vitamin D deficiency 08/21/2020  . Sjogren's syndrome ([place]) 07/17/2020  . Personal history of diseases of the blood and blood-forming organs and certain disorders involving the immune mechanism 07/17/2020  . Fibromyalgia 07/17/2020  . Chronic kidney disease, stage 4 (severe) ([place]) 07/17/2020  . Chronic constipation 07/17/2020  . Anxiety disorder 07/17/2020  . Amnesia 07/17/2020  . Allergic rhinitis 07/17/2020    Past Medical History:  Diagnosis Date  . Anxiety   . Blood dyscrasia    remission of ITP  . Chronic kidney disease    THIRD STAGE KIDNEY DISEASE - 30%  . Fibromyalgia   . Sjogren's syndrome ([place])   . TMJ click   . TMJ syndrome     Family History  Problem Relation Age of Onset  . Hypertension Mother   . Heart attack Mother   . Bone cancer Father   . Multiple myeloma Father   . Kidney disease Sister    Past Surgical History:  Procedure Laterality Date  . APPENDECTOMY    . BREAST SURGERY     AUGMENTATION  . HYSTEROSCOPY WITH D & C  03/28/2012   Procedure: DILATATION AND CURETTAGE /HYSTEROSCOPY;  Surgeon: Thurnell Lose, MD;  Location: [facility];  Service: Gynecology;  Laterality: N/A;  . SPLEENECTOMY  12/05/83   had both (yes, she had two) spleens removed this date  . TONSILLECTOMY     Social History   Social History Narrative  . Not on file   Immunization History  Administered Date(s) Administered  . Influenza Split 12/29/2008, 01/26/2011, 12/28/2011, 01/01/2013, 10/21/2015, 01/05/2017  . Influenza,inj,Quad PF,6+ Mos 12/31/2015, 01/17/2019  . Influenza,inj,quad, With Preservative  01/28/2014, 12/30/2014  . Td 09/02/2003  . Tdap 12/28/2011     Objective: Vital Signs: BP 118/80 (BP Location: Left Arm, Patient Position: Sitting, Cuff Size: Normal)   Pulse 97   Ht 5' 5"  (1.651 m)   Wt 121 lb 12.8 oz (55.2 kg)   BMI 20.27 kg/m    Physical Exam Eyes:     Conjunctiva/sclera: Conjunctivae normal.  Skin:    General: Skin is warm and dry.     Findings: No  rash.  Neurological:     General: No focal deficit present.     Mental Status: She is alert.  Psychiatric:        Mood and Affect: Mood normal.      Musculoskeletal Exam:  Wrists full ROM no tenderness or swelling Fingers full ROM no tenderness or swelling  Investigation: No additional findings.  Imaging: No results found.  Recent Labs: Lab Results  Component Value Date   WBC 11.7 (H) 05/27/2016   HGB 10.8 (L) 05/27/2016   PLT 338 05/27/2016   NA 138 05/27/2016   K 3.3 (L) 05/27/2016   CL 109 05/27/2016   CO2 23 05/27/2016   GLUCOSE 155 (H) 05/27/2016   BUN 24 (H) 05/27/2016   CREATININE 2.36 (H) 05/27/2016   BILITOT 0.5 05/27/2016   ALKPHOS 100 05/27/2016   AST 30 05/27/2016   ALT 29 05/27/2016   PROT 7.7 05/27/2016   ALBUMIN 3.9 05/27/2016   CALCIUM 8.4 (L) 05/27/2016   GFRAA 26 (L) 05/27/2016    Speciality Comments: No specialty comments available.  Procedures:  No procedures performed Allergies: Aspirin, Gabapentin, and Lyrica [pregabalin]   Assessment / Plan:     Visit Diagnoses: Sjogren's syndrome, with unspecified organ involvement ([place]) - Plan: hydroxychloroquine (PLAQUENIL) 200 MG tablet  Mildly elevated sedimentation rate may represent some inflammatory disease activity.  Treatment options recommended would be limited given significant chronic renal disease.  Discussed trial of treatment with oral hydroxychloroquine this may be beneficial for her arthralgias in particular I am less confident for improvement in fatigue and dryness symptoms.  This adjustment due to renal function so recommend 200 mg taken Monday through Friday which achieves approximately average 150 mg daily dose.  She is also continuing cevimeline 30 mg 3 times daily.  Fibromyalgia  Symptoms currently partially controlled with Savella 100 mg twice daily and as needed Flexeril 10 mg.  Symptoms seem incompletely controlled but she has had intolerance or lack of response to several other  FDA approved treatments and tricyclic antidepressants may worsen her dry eyes and mouth symptoms.  Chronic kidney disease, stage 4 (severe) ([facility])  Renally dose adjusting treatment as detailed above for her CKD.  Chronic constipation  She is having a lot of trouble with the chronic constipation.  Never had any history of pancreatitis not sure if this is directly related to the Sjogren's syndrome or not or more the fibromyalgia.  She has used multiple different treatment options she does not have great success with a variety of stool softeners and with senna she did not tolerate use of MiraLAX supplement.  She has had previous colonoscopy with polyp findings but no other serious problems.  Could benefit with the visit for GI recommendations of symptoms are getting worse over time.  Orders: No orders of the defined types were placed in this encounter.  Meds ordered this encounter  Medications  . hydroxychloroquine (PLAQUENIL) 200 MG tablet    Sig: Take 1 tablet by mouth once daily on Monday-Friday  Dispense:  60 tablet    Refill:  0     Follow-Up Instructions: Return in about 8 weeks (around 10/21/2020).   Collier Salina, MD  Note - This record has been created using [employer].  Chart creation errors have been sought, but may not always  have been located. Such creation errors do not reflect on  the standard of medical care.

## 2020-08-26 NOTE — Telephone Encounter (Signed)
Spoke with patient, advised, per Dr. Benjamine Mola, lab results show highly positive antibody level for sjogren's syndrome. The sedimentation rate which is a marker for inflammation is mildly elevated currently. This may indicate there is some active inflammation contributing to her symptoms. We should probably schedule a return visit to discuss options for this, since her kidney function increases risks for some medications.  Patient has been scheduled for follow-up visit 08/26/2020.

## 2020-09-02 DIAGNOSIS — D631 Anemia in chronic kidney disease: Secondary | ICD-10-CM | POA: Diagnosis not present

## 2020-09-02 DIAGNOSIS — I10 Essential (primary) hypertension: Secondary | ICD-10-CM | POA: Diagnosis not present

## 2020-09-02 DIAGNOSIS — N2581 Secondary hyperparathyroidism of renal origin: Secondary | ICD-10-CM | POA: Diagnosis not present

## 2020-09-02 DIAGNOSIS — N184 Chronic kidney disease, stage 4 (severe): Secondary | ICD-10-CM | POA: Diagnosis not present

## 2020-09-16 ENCOUNTER — Ambulatory Visit: Payer: BC Managed Care – PPO | Admitting: Family Medicine

## 2020-09-16 DIAGNOSIS — Z79899 Other long term (current) drug therapy: Secondary | ICD-10-CM | POA: Diagnosis not present

## 2020-09-16 DIAGNOSIS — H04123 Dry eye syndrome of bilateral lacrimal glands: Secondary | ICD-10-CM | POA: Diagnosis not present

## 2020-09-29 ENCOUNTER — Encounter: Payer: Self-pay | Admitting: Family Medicine

## 2020-09-29 ENCOUNTER — Other Ambulatory Visit: Payer: Self-pay

## 2020-09-29 ENCOUNTER — Ambulatory Visit (INDEPENDENT_AMBULATORY_CARE_PROVIDER_SITE_OTHER): Payer: BC Managed Care – PPO | Admitting: Family Medicine

## 2020-09-29 VITALS — BP 123/71 | HR 93 | Temp 97.4°F | Wt 118.2 lb

## 2020-09-29 DIAGNOSIS — Z1231 Encounter for screening mammogram for malignant neoplasm of breast: Secondary | ICD-10-CM | POA: Diagnosis not present

## 2020-09-29 DIAGNOSIS — N189 Chronic kidney disease, unspecified: Secondary | ICD-10-CM | POA: Diagnosis not present

## 2020-09-29 DIAGNOSIS — M797 Fibromyalgia: Secondary | ICD-10-CM

## 2020-09-29 DIAGNOSIS — Z9889 Other specified postprocedural states: Secondary | ICD-10-CM | POA: Diagnosis not present

## 2020-09-29 DIAGNOSIS — M3500 Sicca syndrome, unspecified: Secondary | ICD-10-CM

## 2020-09-29 DIAGNOSIS — N184 Chronic kidney disease, stage 4 (severe): Secondary | ICD-10-CM

## 2020-09-29 DIAGNOSIS — Z124 Encounter for screening for malignant neoplasm of cervix: Secondary | ICD-10-CM | POA: Diagnosis not present

## 2020-09-29 NOTE — Assessment & Plan Note (Signed)
Worsening renal function on recent labs.  She has had repeat labs this week.  Awaiting results.

## 2020-09-29 NOTE — Progress Notes (Signed)
Katherine Contreras - 55 y.o. female MRN [id number]  Date of birth: December 31, 1965  Subjective No chief complaint on file.   HPI Katherine Contreras is a 55 y.o. female here today for follow up.   She has established with rheumatology and started on plaquenil.  Some improvement of Sjogren symptoms since starting this.  Continues to have pain related to fibromyalgia.  Gabapentin and lyrica were not tolerated well. She is unable to utilize NSAIDS due to CKD.    She sees nephrology for management of CKD.  Her renal function had worsened some omn recent labs.  She also had some anemia on recent labs with hgb around 8.3.  She did have repeat labs this week.  She has had some mild dizziness at times when standing.  She denies significant fatigue.    ROS:  A comprehensive ROS was completed and negative except as noted per HPI  Allergies  Allergen Reactions   Aspirin Other (See Comments)    SPLEEN REMOVED AND HISTORY OF PLATELET PROBLEM IN THE PAST Pt takes alleve if needed   Gabapentin Nausea Only and Other (See Comments)    Dizziness, Syncope   Lyrica [Pregabalin]     Past Medical History:  Diagnosis Date   Anxiety    Blood dyscrasia    remission of ITP   Chronic kidney disease    THIRD STAGE KIDNEY DISEASE - 30%   Fibromyalgia    Sjogren's syndrome (HCC)    TMJ click    TMJ syndrome     Past Surgical History:  Procedure Laterality Date   APPENDECTOMY     BREAST SURGERY     AUGMENTATION   HYSTEROSCOPY WITH D & C  03/28/2012   Procedure: DILATATION AND CURETTAGE /HYSTEROSCOPY;  Surgeon: Thurnell Lose, MD;  Location: [facility];  Service: Gynecology;  Laterality: N/A;   SPLEENECTOMY  12/05/83   had both (yes, she had two) spleens removed this date   TONSILLECTOMY      Social History   Socioeconomic History   Marital status: Divorced    Spouse name: Not on file   Number of children: Not on file   Years of education: Not on file   Highest education level: Not on file  Occupational History    Occupation: Musician  Tobacco Use   Smoking status: Never   Smokeless tobacco: Never  Vaping Use   Vaping Use: Never used  Substance and Sexual Activity   Alcohol use: Yes    Comment: Occasionally   Drug use: No   Sexual activity: Not Currently    Partners: Male  Other Topics Concern   Not on file  Social History Narrative   Not on file   Social Determinants of Health   Financial Resource Strain: Not on file  Food Insecurity: Not on file  Transportation Needs: Not on file  Physical Activity: Not on file  Stress: Not on file  Social Connections: Not on file    Family History  Problem Relation Age of Onset   Hypertension Mother    Heart attack Mother    Bone cancer Father    Multiple myeloma Father    Kidney disease Sister     Health Maintenance  Topic Date Due   COVID-19 Vaccine (1) Never done   Pneumococcal Vaccine 66-54 Years old (1 - PCV) Never done   HIV Screening  Never done   Hepatitis C Screening  Never done   Zoster Vaccines- Shingrix (1 of 2) Never done  PAP SMEAR-Modifier  Never done   COLONOSCOPY (Pts 45-71yr Insurance coverage will need to be confirmed)  Never done   MAMMOGRAM  Never done   INFLUENZA VACCINE  11/16/2020   TETANUS/TDAP  12/27/2021   HPV VACCINES  Aged Out     ----------------------------------------------------------------------------------------------------------------------------------------------------------------------------------------------------------------- Physical Exam BP 123/71 (BP Location: Left Arm, Patient Position: Sitting, Cuff Size: Small)   Pulse 93   Temp (!) 97.4 F (36.3 C)   Wt 118 lb 3.2 oz (53.6 kg)   SpO2 100%   BMI 19.67 kg/m   Physical Exam Constitutional:      Appearance: Normal appearance.  Eyes:     General: No scleral icterus. Cardiovascular:     Rate and Rhythm: Normal rate and regular rhythm.  Pulmonary:     Effort: Pulmonary effort is normal.     Breath sounds: Normal breath  sounds.  Musculoskeletal:     Cervical back: Neck supple.  Neurological:     General: No focal deficit present.     Mental Status: She is alert.  Psychiatric:        Mood and Affect: Mood normal.        Behavior: Behavior normal.    ------------------------------------------------------------------------------------------------------------------------------------------------------------------------------------------------------------------- Assessment and Plan  Chronic kidney disease, stage 4 (severe) [place] Worsening renal function on recent labs.  She has had repeat labs this week.  Awaiting results.    S[place]'s syndrome [place] Managed by rheumatology.  Currently on plaquenil.    Fibromyalgia She has been unable to tolerate gabapentin and lyrica.  Will continue to monitor for now.   No orders of the defined types were placed in this encounter.   Return in about 6 months (around 03/31/2021) for GAD.    This visit occurred during the SARS-CoV-2 public health emergency.  Safety protocols were in place, including screening questions prior to the visit, additional usage of staff PPE, and extensive cleaning of exam room while observing appropriate contact time as indicated for disinfecting solutions.

## 2020-09-29 NOTE — Assessment & Plan Note (Signed)
She has been unable to tolerate gabapentin and lyrica.  Will continue to monitor for now.

## 2020-09-29 NOTE — Assessment & Plan Note (Signed)
Managed by rheumatology.  Currently on plaquenil.

## 2020-09-30 ENCOUNTER — Encounter: Payer: Self-pay | Admitting: Family Medicine

## 2020-09-30 LAB — PHOSPHORUS: Phosphorus: 4.2

## 2020-09-30 LAB — MAGNESIUM: Magnesium: 2.3

## 2020-09-30 LAB — PTH, INTACT: PTH, Intact: 32

## 2020-09-30 LAB — CALCIUM: Calcium: 9.2

## 2020-10-07 DIAGNOSIS — N185 Chronic kidney disease, stage 5: Secondary | ICD-10-CM | POA: Diagnosis not present

## 2020-10-07 DIAGNOSIS — I10 Essential (primary) hypertension: Secondary | ICD-10-CM | POA: Diagnosis not present

## 2020-10-07 DIAGNOSIS — D631 Anemia in chronic kidney disease: Secondary | ICD-10-CM | POA: Diagnosis not present

## 2020-10-07 DIAGNOSIS — N2581 Secondary hyperparathyroidism of renal origin: Secondary | ICD-10-CM | POA: Diagnosis not present

## 2020-10-12 ENCOUNTER — Ambulatory Visit (INDEPENDENT_AMBULATORY_CARE_PROVIDER_SITE_OTHER): Payer: BC Managed Care – PPO | Admitting: Obstetrics & Gynecology

## 2020-10-12 ENCOUNTER — Other Ambulatory Visit (HOSPITAL_COMMUNITY)
Admission: RE | Admit: 2020-10-12 | Discharge: 2020-10-12 | Disposition: A | Discharge status: Home / Self Care | Payer: BC Managed Care – PPO | Source: Ambulatory Visit | Attending: Obstetrics & Gynecology | Admitting: Obstetrics & Gynecology

## 2020-10-12 ENCOUNTER — Other Ambulatory Visit: Payer: Self-pay

## 2020-10-12 ENCOUNTER — Encounter: Payer: Self-pay | Admitting: Obstetrics & Gynecology

## 2020-10-12 VITALS — BP 102/69 | HR 92 | Resp 16 | Ht 65.0 in | Wt 119.0 lb

## 2020-10-12 DIAGNOSIS — M3500 Sicca syndrome, unspecified: Secondary | ICD-10-CM

## 2020-10-12 DIAGNOSIS — K5909 Other constipation: Secondary | ICD-10-CM | POA: Diagnosis not present

## 2020-10-12 DIAGNOSIS — Z01419 Encounter for gynecological examination (general) (routine) without abnormal findings: Secondary | ICD-10-CM | POA: Diagnosis not present

## 2020-10-12 DIAGNOSIS — Z1151 Encounter for screening for human papillomavirus (HPV): Secondary | ICD-10-CM | POA: Insufficient documentation

## 2020-10-12 DIAGNOSIS — N952 Postmenopausal atrophic vaginitis: Secondary | ICD-10-CM

## 2020-10-12 MED ORDER — ESTRADIOL 0.1 MG/GM VA CREA: TOPICAL_CREAM | VAGINAL | 2 refills | Status: DC

## 2020-10-12 NOTE — Progress Notes (Signed)
Subjective:     Katherine Contreras is a 55 y.o. female here for a routine exam.  Current complaints: Pt needs pap smear for f/u of LEEP by Dr. Barkley Boards.  Pt wants all her records in [place].  Pt has bad constipation and has not had relief with Dulcolax.  She has not been able to take Miraoax in the past.     Gynecologic History No LMP recorded. Patient is perimenopausal. Contraception: post menopausal status Last Pap: 2020. Results were: abnormal Cervix, LEEP, red 12 o'clock, yellow 2 o'clock, green endo, other fragments - ecto -CERVICAL TRANSFORMATION ZONE MUCOSA WITH MARKED CHRONIC INFLAMMATION AND CIN-I/II (MILD - MODERATE SQUAMOUS DYSPLASIA; LOW - HIGH GRADE SQUAMOUS INTRAEPITHELIAL LESION). -NO INVASIVE NEOPLASM IDENTIFIED. -MARGINS FREE OF NEOPLASM. Last mammogram: uncertain, no exams at [facility].  Obstetric History OB History  Gravida Para Term Preterm AB Living  1 1          SAB IAB Ectopic Multiple Live Births          1    # Outcome Date GA Lbr Len/2nd Weight Sex Delivery Anes PTL Lv  1 Para      Vag-Spont        The following portions of the patient's history were reviewed and updated as appropriate: allergies, current medications, past family history, past medical history, past social history, past surgical history, and problem list.  Review of Systems Pertinent items noted in HPI and remainder of comprehensive ROS otherwise negative.    Objective:     Vitals:   10/12/20 1014  BP: 102/69  Pulse: 92  Resp: 16  Weight: 119 lb (54 kg)  Height: '5\' 5"'$  (1.651 m)   Vitals:  WNL General appearance: alert, cooperative and no distress  HEENT: Normocephalic, without obvious abnormality, atraumatic Eyes: negative Throat: lips, mucosa, and tongue normal; teeth and gums normal  Respiratory: Clear to auscultation bilaterally  CV: Regular rate and rhythm  Breasts:  Normal appearance, no masses or tenderness, no nipple retraction or dimpling  GI: Soft, non-tender; bowel  sounds normal; no masses,  no organomegaly  GU: External Genitalia:  Tanner V, no lesion Urethra:  No prolapse   Vagina: Arophic vaginitis  Cervix: No CMT, no lesion  Uterus:  Normal size and contour, non tender  Adnexa: Normal, no masses, non tender; exam limited by stool  Musculoskeletal: No edema, redness or tenderness in the calves or thighs  Skin: No lesions or rash  Lymphatic: Axillary adenopathy: none     Psychiatric: Normal mood and behavior   Assessment:    Healthy female exam.  Chronic constipation Atrophic vaginitis   Plan:   Pap with cotesting Aptima for vaginits Vaginal estrogen Referral to GI at [facility] for chronic constipation  Calcium and Vitamin D for bone health

## 2020-10-12 NOTE — Progress Notes (Signed)
H/O [place].  Sent here by PCP for pap

## 2020-10-13 ENCOUNTER — Other Ambulatory Visit (HOSPITAL_COMMUNITY): Payer: Self-pay | Admitting: *Deleted

## 2020-10-13 ENCOUNTER — Other Ambulatory Visit: Payer: Self-pay | Admitting: Obstetrics & Gynecology

## 2020-10-13 LAB — CYTOLOGY - PAP
Comment: NEGATIVE
Diagnosis: NEGATIVE
High risk HPV: NEGATIVE

## 2020-10-13 LAB — CERVICOVAGINAL ANCILLARY ONLY
Bacterial Vaginitis (gardnerella): POSITIVE — AB
Candida Glabrata: NEGATIVE
Candida Vaginitis: NEGATIVE
Comment: NEGATIVE
Comment: NEGATIVE
Comment: NEGATIVE

## 2020-10-13 MED ORDER — METRONIDAZOLE 500 MG PO TABS: 500.0000 mg | ORAL_TABLET | Freq: Two times a day (BID) | ORAL | 0 refills | Status: DC

## 2020-10-13 NOTE — Progress Notes (Signed)
Rx prescribed for Flagyl to treat BV.

## 2020-10-14 ENCOUNTER — Encounter (HOSPITAL_COMMUNITY)
Admission: RE | Admit: 2020-10-14 | Discharge: 2020-10-14 | Disposition: A | Discharge status: Home / Self Care | Payer: BC Managed Care – PPO | Source: Ambulatory Visit | Attending: Nephrology | Admitting: Nephrology

## 2020-10-14 DIAGNOSIS — D631 Anemia in chronic kidney disease: Secondary | ICD-10-CM | POA: Diagnosis not present

## 2020-10-14 DIAGNOSIS — N189 Chronic kidney disease, unspecified: Secondary | ICD-10-CM | POA: Diagnosis not present

## 2020-10-14 MED ORDER — SODIUM CHLORIDE 0.9 % IV SOLN
510.0000 mg | INTRAVENOUS | Status: DC
  Administered 2020-10-14: 510 mg via INTRAVENOUS
  Filled 2020-10-14: qty 510

## 2020-10-21 ENCOUNTER — Other Ambulatory Visit: Payer: Self-pay

## 2020-10-21 ENCOUNTER — Encounter: Payer: Self-pay | Admitting: Internal Medicine

## 2020-10-21 ENCOUNTER — Ambulatory Visit: Payer: BC Managed Care – PPO | Admitting: Internal Medicine

## 2020-10-21 VITALS — BP 112/76 | HR 105 | Resp 14 | Ht 65.0 in | Wt 120.0 lb

## 2020-10-21 DIAGNOSIS — N184 Chronic kidney disease, stage 4 (severe): Secondary | ICD-10-CM | POA: Diagnosis not present

## 2020-10-21 DIAGNOSIS — M25512 Pain in left shoulder: Secondary | ICD-10-CM | POA: Diagnosis not present

## 2020-10-21 DIAGNOSIS — M3501 Sicca syndrome with keratoconjunctivitis: Secondary | ICD-10-CM | POA: Diagnosis not present

## 2020-10-21 NOTE — Progress Notes (Signed)
Office Visit Note  Patient: Katherine Contreras             Date of Birth: [birth year]           MRN: [id number]             PCP: Luetta Nutting, DO Referring: Luetta Nutting, DO Visit Date: 10/21/2020   Subjective:  New Patient (Initial Visit) (Patient is taking PLQ 200 mg Monday-Friday and notices some improvement with symptoms. )   History of Present Illness: Katherine Contreras is a 55 y.o. female here for follow up for sjogren's syndrome with arthralgias and keratoconjunctivitis sicca symptoms after starting hydroxychloroquine about 2 months ago. She feels her symptoms are overall somewhat improved. Her knee pain is decreased and has not experienced any flare or episodes of swelling. She thinks eye and mouth dryness may be slightly improved although she has not been singing recently which was the most problematic activity for this. She has left shoulder pain ongoing since at least several weeks ago or possibly a couple months that bothers her intermittently mostly with use. She initially attributed this to her fibromyalgia but it did not improve as quickly as these symptoms usually do. Her renal function numbers were worse with eGFR back below 15 so is discussing plans again with nephrology.  Previous HPI: 08/26/20 Katherine Contreras is a 55 y.o. female with sjogren syndrome and fibromyalgia here for follow up and treatment plans discussion.  She was having primary issues with severe fatigue some joint pains dryness of the eyes and mouth and chronic constipation.  Labs showed positive serology SSA >8.0 and mildly elevated sedimentation rate of 34.  She has had no significant change in symptoms since the last visit.  08/21/20 Katherine Contreras is a 55 y.o. female here for evaluation of sjogren's syndrome and fibromyalgia.  She states original diagnosis of Sjogren's syndrome was around 1985 with rheumatology at the time she mostly recalls the fibromyalgia symptoms as predominant but over subsequent years has  noticed worsening symptoms with chronic dry eyes and dry mouth.  She has been treated with cevimeline with good improvement in her oral dryness.  She never developed any particularly inflammatory joint changes however chronic fibromyalgia pain is also worsened with some osteoarthritis especially in the right knee.  She has been treated with multiple drugs including Elavil, Lyrica, Cymbalta, and currently takes [facility] which he feels like is helpful but no longer fully controlling her symptoms.  She was recently tried with addition of Lyrica but made her feel like a drunk sensation.  She works with a Clinical research associate and exercising in the pool which have been very beneficial for her symptoms.  Current problems are primarily the sicca syndrome, chronic fatigue, chronic constipation, plus or diffuse joint pain and body aches.  She has advanced chronic kidney disease now estimated as stage IV thought to be contributed largely to chronic NSAID use and sees Dr. Graylon Gunning at [facility] for this.  She reports she has been on a kidney transplant list for about 4 years.  Review of Systems  Constitutional:  Positive for fatigue.  HENT:  Positive for mouth dryness and nose dryness. Negative for mouth sores.   Eyes:  Positive for dryness. Negative for pain, itching and visual disturbance.  Respiratory:  Negative for cough, hemoptysis, shortness of breath and difficulty breathing.   Cardiovascular:  Positive for irregular heartbeat. Negative for chest pain, palpitations and swelling in legs/feet.  Gastrointestinal:  Positive for constipation  and diarrhea. Negative for abdominal pain and blood in stool.  Endocrine: Negative for increased urination.  Genitourinary:  Negative for painful urination.  Musculoskeletal:  Positive for joint pain and joint pain. Negative for joint swelling, myalgias, muscle weakness, morning stiffness, muscle tenderness and myalgias.  Skin:  Negative for color change, rash and redness.   Allergic/Immunologic: Negative for susceptible to infections.  Neurological:  Positive for dizziness. Negative for numbness, headaches, memory loss and weakness.  Hematological:  Negative for swollen glands.  Psychiatric/Behavioral:  Negative for confusion and sleep disturbance.    PMFS History:  Patient Active Problem List   Diagnosis Date Noted   Pain in left shoulder 10/21/2020   Vitamin D deficiency 08/21/2020   Sjogren's syndrome ([place]) 07/17/2020   Personal history of diseases of the blood and blood-forming organs and certain disorders involving the immune mechanism 07/17/2020   Fibromyalgia 07/17/2020   Chronic kidney disease, stage 4 (severe) ([place]) 07/17/2020   Chronic constipation 07/17/2020   Anxiety disorder 07/17/2020   Amnesia 07/17/2020   Allergic rhinitis 07/17/2020    Past Medical History:  Diagnosis Date   Anxiety    Blood dyscrasia    remission of ITP   Chronic kidney disease    THIRD STAGE KIDNEY DISEASE - 30%   Fibromyalgia    Sjogren's syndrome (HCC)    TMJ click    TMJ syndrome     Family History  Problem Relation Age of Onset   Hypertension Mother    Heart attack Mother    Bone cancer Father    Multiple myeloma Father    Kidney disease Sister    Past Surgical History:  Procedure Laterality Date   APPENDECTOMY     BREAST SURGERY     AUGMENTATION   HYSTEROSCOPY WITH D & C  03/28/2012   Procedure: DILATATION AND CURETTAGE /HYSTEROSCOPY;  Surgeon: Thurnell Lose, MD;  Location: [facility];  Service: Gynecology;  Laterality: N/A;   SPLEENECTOMY  12/05/83   had both (yes, she had two) spleens removed this date   TONSILLECTOMY     Social History   Social History Narrative   Not on file   Immunization History  Administered Date(s) Administered   Influenza Split 12/29/2008, 01/26/2011, 12/28/2011, 01/01/2013, 10/21/2015, 01/05/2017   Influenza,inj,Quad PF,6+ Mos 12/31/2015, 01/17/2019   Influenza,inj,quad, With Preservative 01/28/2014, 12/30/2014    Td 09/02/2003   Tdap 12/28/2011     Objective: Vital Signs: BP 112/76 (BP Location: Left Arm, Patient Position: Sitting, Cuff Size: Normal)   Pulse (!) 105   Resp 14   Ht _0  (1.651 m)   Wt 120 lb (54.4 kg)   BMI 19.97 kg/m    Physical Exam Eyes:     Conjunctiva/sclera: Conjunctivae normal.  Skin:    General: Skin is warm and dry.     Comments: Erythema and dry skin over finger joints bilaterally and nail beds, no pitting or ridges present  Neurological:     General: No focal deficit present.     Mental Status: She is alert.  Psychiatric:        Mood and Affect: Mood normal.     Musculoskeletal Exam:  Shoulders right full ROM, left pain with resisted abduction positive neers and hawkins test, tenderness localizing mostly to lateral border of glenohumeral joint Elbows full ROM no tenderness or swelling Wrists full ROM no tenderness or swelling Fingers full ROM no tenderness or swelling Knees full ROM, no tenderness or swelling  Investigation: No additional findings.  Imaging:  No results found.  Recent Labs: Lab Results  Component Value Date   WBC 8.8 08/26/2020   HGB 8.9 (A) 08/26/2020   PLT 343 08/26/2020   NA 140 08/26/2020   K 3.5 08/26/2020   CL 111 (A) 08/26/2020   CO2 18 08/26/2020   GLUCOSE 155 (H) 05/27/2016   BUN 40 (A) 08/26/2020   CREATININE 4.1 (A) 08/26/2020   BILITOT 0.5 05/27/2016   ALKPHOS 100 05/27/2016   AST 30 05/27/2016   ALT 29 05/27/2016   PROT 7.7 05/27/2016   ALBUMIN 4.3 08/26/2020   CALCIUM 9.2 08/26/2020   CALCIUM 9.2 08/26/2020   GFRAA 26 (L) 05/27/2016    Speciality Comments: No specialty comments available.  Procedures:  No procedures performed Allergies: Aspirin, Gabapentin, and Lyrica [pregabalin]   Assessment / Plan:     Visit Diagnoses: Sjogren's syndrome with keratoconjunctivitis sicca ([place]) - Plan: Sedimentation rate, Hydroxychloroquine, blood  Symptoms reported as partially improved since starting  hydroxychloroquine 200 mg 5 days/week about 2 months ago.  She continues use of cevimeline 3 times daily for dry mouth thinks is is doing pretty well but she has not been challenging it due to not singing.  Plan to recheck sedimentation rate to see if there is any response to treatment on objective markers.  We will check blood hydroxychloroquine concentration today to better estimate current therapeutic dosage.  Chronic kidney disease, stage 4 (severe) (HCC) - Plan: Hydroxychloroquine, blood  Currently on low hydroxychloroquine dosing due to severe chronic kidney disease.  We will check home blood hydroxychloroquine levels to estimate whether this is above or below target concentration.  Left shoulder pain, unspecified chronicity  Shoulder has pain but no decreased range of motion or decreased strength to suggest very severe injury.  Recommend she continue the stretching and water based exercises for therapy on this probable bursitis or tendinopathy.  She is not a candidate for oral anti-inflammatories.  I do not believe symptoms are severe enough to require advanced imaging or injection therapy at this time.  Orders: Orders Placed This Encounter  Procedures   Sedimentation rate   Hydroxychloroquine, Blood    No orders of the defined types were placed in this encounter.    Follow-Up Instructions: Return in about 6 months (around 04/23/2021) for pSS on HCQ f/u 55mo.   CCollier Salina MD  Note - This record has been created using [employer]  Chart creation errors have been sought, but may not always  have been located. Such creation errors do not reflect on  the standard of medical care.

## 2020-10-22 IMAGING — MR MR HEAD W/O CM
11 series · 48 of 48 positions shown · non-contrast
Comparison: CT head 05/27/2016.

CLINICAL DATA: Stabbing pain on top of head 3 days.

EXAM:
MRI HEAD WITHOUT CONTRAST
TECHNIQUE: Multiplanar, multiecho pulse sequences of the brain and surrounding
structures were obtained without intravenous contrast.

[Series 2: t1_se_sag · sagittal · 5.0mm · 0.45mm/px · 3 of 21 slices shown]
[im 1/21]
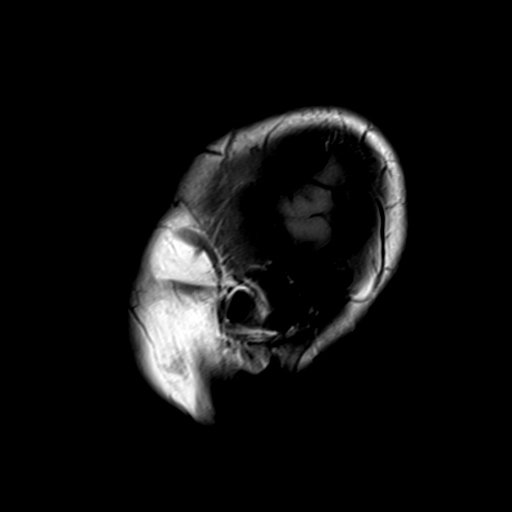
[im 11/21]
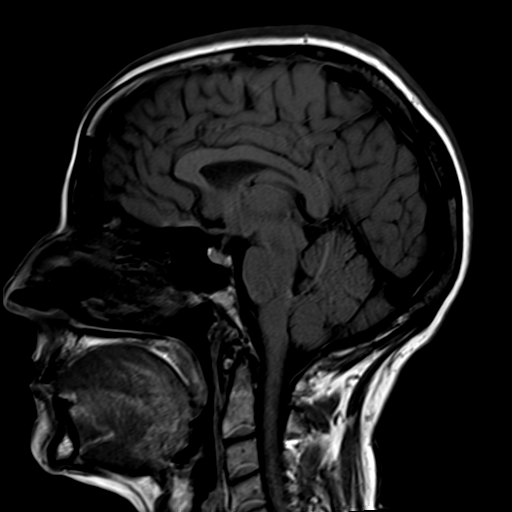
[im 21/21]
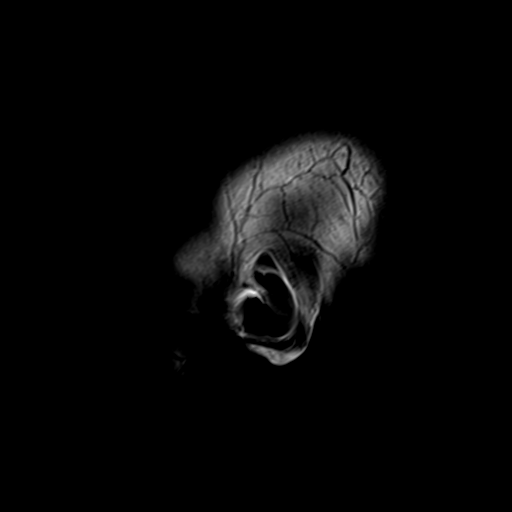

[Series 3: t2_tse_tra_512 · axial · 5.0mm · 0.60mm/px · z∈[+4,+138]mm · 3 of 24 slices shown]
[im 1/24]
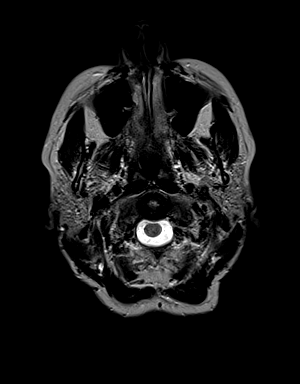
[im 12/24]
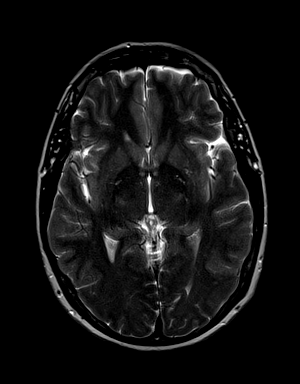
[im 24/24]
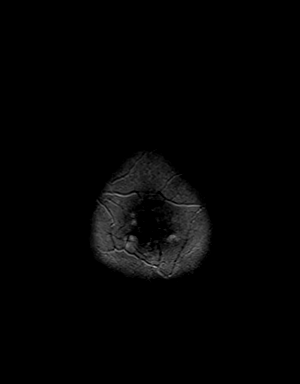

[Series 4: ep2d_diff_(id)_trace · axial · 3.0mm · 1.80mm/px · z∈[+6,+137]mm · 9 of 92 slices shown]
[im 1/92]
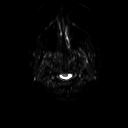
[im 12/92]
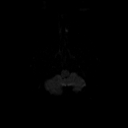
[im 23/92]
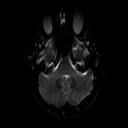
[im 35/92]
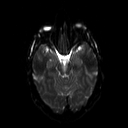
[im 46/92]
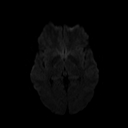
[im 57/92]
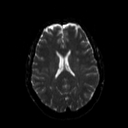
[im 69/92]
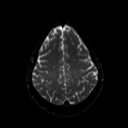
[im 80/92]
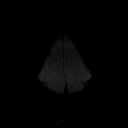
[im 92/92]
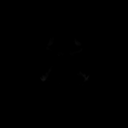

[Series 5: ep2d_diff_(id)_trace_adc · axial · 3.0mm · 1.80mm/px · z∈[+6,+137]mm · 4 of 46 slices shown]
[im 1/46]
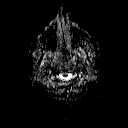
[im 16/46]
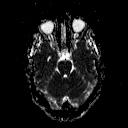
[im 31/46]
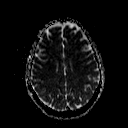
[im 46/46]
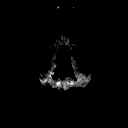

[Series 6: ep2d_diff_cor · coronal · 5.0mm · 1.77mm/px · 5 of 54 slices shown]
[im 1/54]
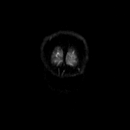
[im 14/54]
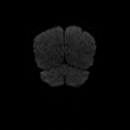
[im 27/54]
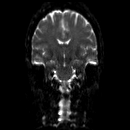
[im 40/54]
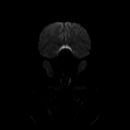
[im 54/54]
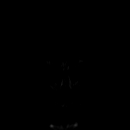

[Series 7: ep2d_diff_cor_adc · coronal · 5.0mm · 1.77mm/px · 2 of 27 slices shown]
[im 1/27]
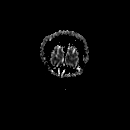
[im 27/27]
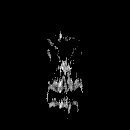

[Series 9: swi_images · axial · 4.0mm · 0.90mm/px · z∈[+3,+139]mm · 3 of 36 slices shown]
[im 1/36]
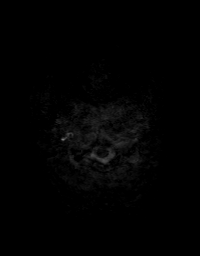
[im 18/36]
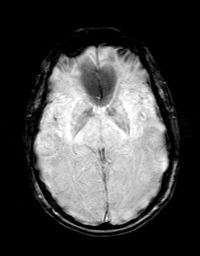
[im 36/36]
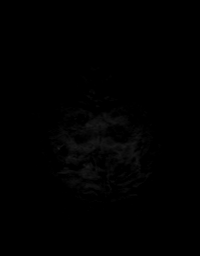

[Series 10: FLAIR · axial · 3.0mm · 0.43mm/px · z∈[-5,+146]mm · 2 of 27 slices shown (1 of 2)]
[im 1/27]
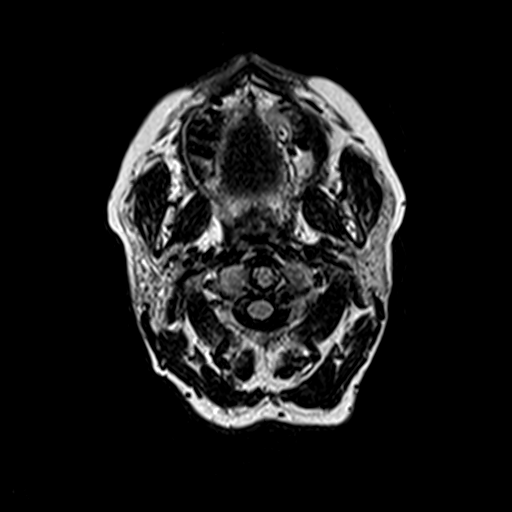
[im 27/27]
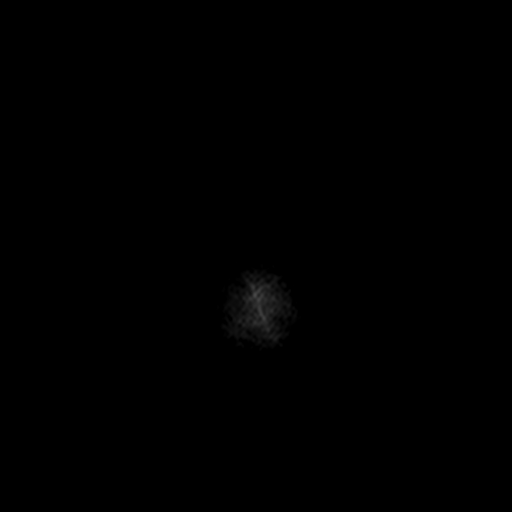

[Series 11: t1_mpr_tra · axial · 1.0mm · 0.72mm/px · z∈[+2,+141]mm · 13 of 144 slices shown]
[im 1/144]
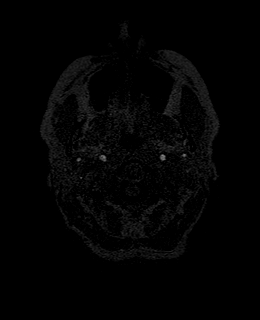
[im 12/144]
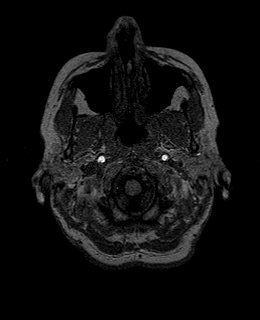
[im 24/144]
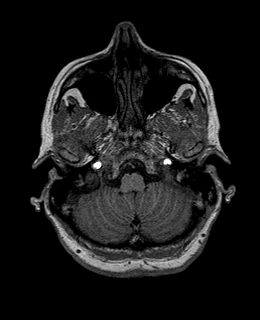
[im 36/144]
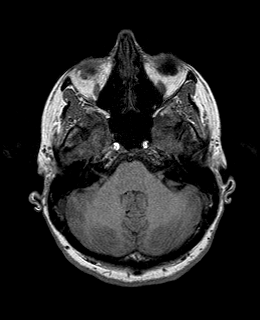
[im 48/144]
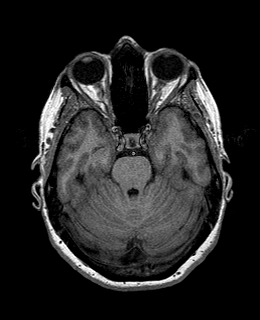
[im 60/144]
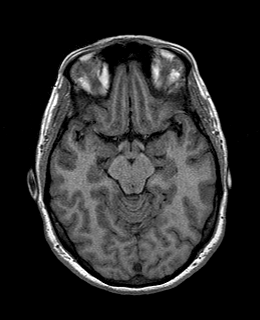
[im 72/144]
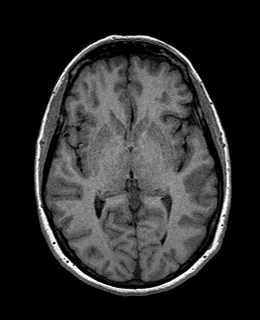
[im 84/144]
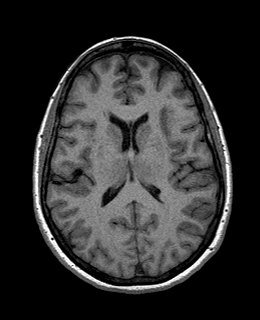
[im 96/144]
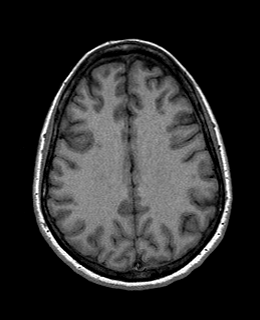
[im 108/144]
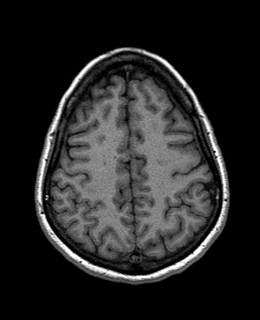
[im 120/144]
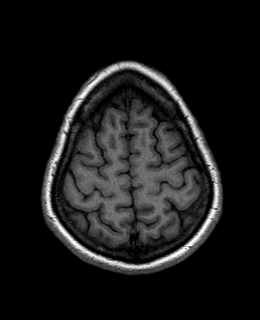
[im 132/144]
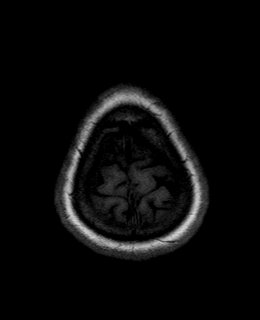
[im 144/144]
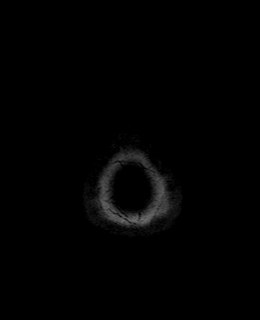

[Series 12: T2 · coronal · 5.0mm · 0.45mm/px · 2 of 27 slices shown]
[im 1/27]
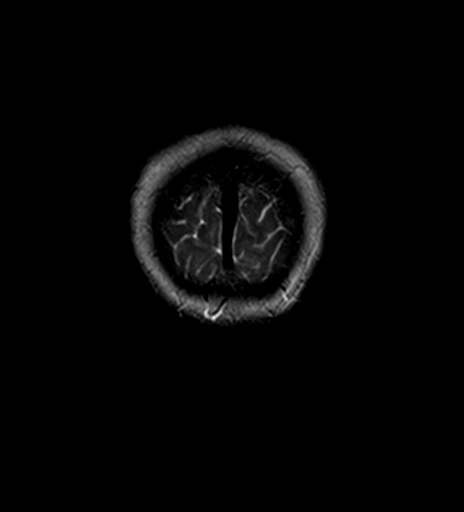
[im 27/27]
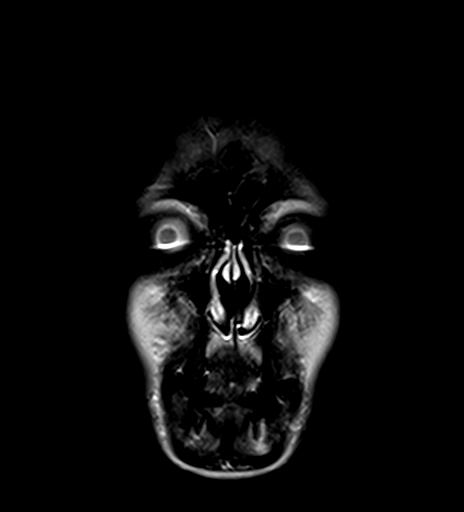

[Series 13: FLAIR · sagittal · 5.0mm · 0.45mm/px · 2 of 27 slices shown (2 of 2)]
[im 1/27]
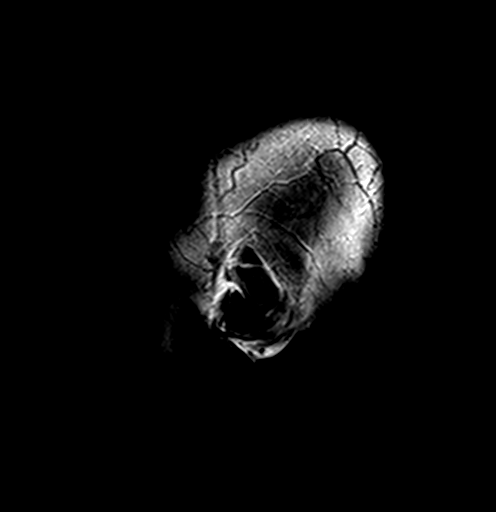
[im 27/27]
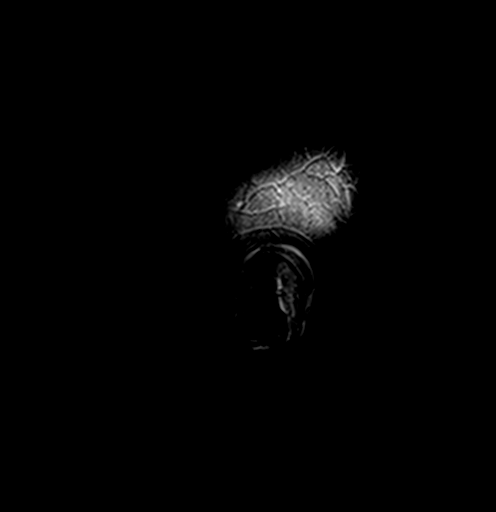

[48 of 48 positions shown; findings below may reference images not displayed]

FINDINGS: Brain: No acute infarction, hemorrhage, hydrocephalus, extra-axial
collection or mass lesion. Normal for age cerebral volume. Mild
subcortical and periventricular T2 and FLAIR hyperintensities,
likely chronic microvascular ischemic change. No large vessel
infarct to suggest vasculitis.

Vascular: Normal flow voids.

Skull and upper cervical spine: Normal marrow signal.

Sinuses/Orbits: Negative.

Other: None.

Compared with prior CT, white matter disease is better visualized.
IMPRESSION: Mild white matter disease, otherwise negative brain.

## 2020-10-23 ENCOUNTER — Other Ambulatory Visit: Payer: Self-pay

## 2020-10-23 ENCOUNTER — Other Ambulatory Visit: Payer: Self-pay | Admitting: Internal Medicine

## 2020-10-23 ENCOUNTER — Ambulatory Visit (HOSPITAL_COMMUNITY)
Admission: RE | Admit: 2020-10-23 | Discharge: 2020-10-23 | Disposition: A | Discharge status: Home / Self Care | Payer: BC Managed Care – PPO | Source: Ambulatory Visit | Attending: Nephrology | Admitting: Nephrology

## 2020-10-23 DIAGNOSIS — N189 Chronic kidney disease, unspecified: Secondary | ICD-10-CM | POA: Insufficient documentation

## 2020-10-23 DIAGNOSIS — D631 Anemia in chronic kidney disease: Secondary | ICD-10-CM | POA: Insufficient documentation

## 2020-10-23 DIAGNOSIS — M3500 Sicca syndrome, unspecified: Secondary | ICD-10-CM

## 2020-10-23 MED ORDER — SODIUM CHLORIDE 0.9 % IV SOLN
510.0000 mg | INTRAVENOUS | Status: DC
  Administered 2020-10-23: 510 mg via INTRAVENOUS
  Filled 2020-10-23: qty 510

## 2020-10-23 NOTE — Telephone Encounter (Signed)
Last Visit: 10/21/2020 Next Visit: nothing scheduled, advised to follow-up in 6 months Labs: 10/21/2020- hydroxychloroquine, blood (pending); sed rate (11) Eye exam: nothing on file   Current Dose per office note 10/21/2020: hydroxychloroquine 200 mg 5 days/week DX: Sjogren's syndrome with keratoconjunctivitis sicca  Last Fill: 08/26/2020  Okay to refill Plaquenil?

## 2020-10-26 DIAGNOSIS — Z01818 Encounter for other preprocedural examination: Secondary | ICD-10-CM | POA: Diagnosis not present

## 2020-10-26 DIAGNOSIS — K6282 Dysplasia of anus: Secondary | ICD-10-CM | POA: Diagnosis not present

## 2020-10-26 DIAGNOSIS — Z862 Personal history of diseases of the blood and blood-forming organs and certain disorders involving the immune mechanism: Secondary | ICD-10-CM | POA: Diagnosis not present

## 2020-10-26 DIAGNOSIS — M797 Fibromyalgia: Secondary | ICD-10-CM | POA: Diagnosis not present

## 2020-10-26 DIAGNOSIS — Z791 Long term (current) use of non-steroidal anti-inflammatories (NSAID): Secondary | ICD-10-CM | POA: Diagnosis not present

## 2020-10-26 DIAGNOSIS — N200 Calculus of kidney: Secondary | ICD-10-CM | POA: Diagnosis not present

## 2020-10-26 DIAGNOSIS — N184 Chronic kidney disease, stage 4 (severe): Secondary | ICD-10-CM | POA: Diagnosis not present

## 2020-10-26 DIAGNOSIS — N186 End stage renal disease: Secondary | ICD-10-CM | POA: Diagnosis not present

## 2020-10-26 DIAGNOSIS — M3500 Sicca syndrome, unspecified: Secondary | ICD-10-CM | POA: Diagnosis not present

## 2020-10-29 LAB — HYDROXYCHLOROQUINE,BLOOD: HYDROXYCHLOROQUINE, (B): 1900 ng/mL — ABNORMAL HIGH

## 2020-10-29 LAB — SEDIMENTATION RATE: Sed Rate: 11 mm/h (ref 0–30)

## 2020-10-30 NOTE — Progress Notes (Signed)
The sedimentation rate decreased suggesting inflammation improvement. However hydroxychloroquine level is above the recommended concentration for long term use by about 25%, so I recommend she go down to taking this just 4 doses per week or every other day.

## 2020-11-03 DIAGNOSIS — N189 Chronic kidney disease, unspecified: Secondary | ICD-10-CM | POA: Diagnosis not present

## 2020-12-02 DIAGNOSIS — N185 Chronic kidney disease, stage 5: Secondary | ICD-10-CM | POA: Diagnosis not present

## 2020-12-08 DIAGNOSIS — N185 Chronic kidney disease, stage 5: Secondary | ICD-10-CM | POA: Diagnosis not present

## 2020-12-08 DIAGNOSIS — D631 Anemia in chronic kidney disease: Secondary | ICD-10-CM | POA: Diagnosis not present

## 2020-12-08 DIAGNOSIS — I10 Essential (primary) hypertension: Secondary | ICD-10-CM | POA: Diagnosis not present

## 2020-12-08 DIAGNOSIS — N2581 Secondary hyperparathyroidism of renal origin: Secondary | ICD-10-CM | POA: Diagnosis not present

## 2020-12-09 DIAGNOSIS — Q8901 Asplenia (congenital): Secondary | ICD-10-CM | POA: Insufficient documentation

## 2020-12-10 DIAGNOSIS — M3500 Sicca syndrome, unspecified: Secondary | ICD-10-CM | POA: Diagnosis not present

## 2020-12-10 DIAGNOSIS — N184 Chronic kidney disease, stage 4 (severe): Secondary | ICD-10-CM | POA: Diagnosis not present

## 2020-12-10 DIAGNOSIS — Z01818 Encounter for other preprocedural examination: Secondary | ICD-10-CM | POA: Diagnosis not present

## 2020-12-10 DIAGNOSIS — Z23 Encounter for immunization: Secondary | ICD-10-CM | POA: Diagnosis not present

## 2020-12-10 DIAGNOSIS — Z7682 Awaiting organ transplant status: Secondary | ICD-10-CM | POA: Diagnosis not present

## 2020-12-10 DIAGNOSIS — Z01810 Encounter for preprocedural cardiovascular examination: Secondary | ICD-10-CM | POA: Diagnosis not present

## 2020-12-11 DIAGNOSIS — Z1231 Encounter for screening mammogram for malignant neoplasm of breast: Secondary | ICD-10-CM | POA: Diagnosis not present

## 2020-12-15 LAB — HM MAMMOGRAPHY

## 2020-12-22 DIAGNOSIS — Z01818 Encounter for other preprocedural examination: Secondary | ICD-10-CM | POA: Diagnosis not present

## 2020-12-22 DIAGNOSIS — N184 Chronic kidney disease, stage 4 (severe): Secondary | ICD-10-CM | POA: Diagnosis not present

## 2020-12-22 DIAGNOSIS — Z01810 Encounter for preprocedural cardiovascular examination: Secondary | ICD-10-CM | POA: Diagnosis not present

## 2020-12-22 DIAGNOSIS — D631 Anemia in chronic kidney disease: Secondary | ICD-10-CM | POA: Diagnosis not present

## 2021-01-07 ENCOUNTER — Encounter: Payer: Self-pay | Admitting: Family Medicine

## 2021-01-16 ENCOUNTER — Other Ambulatory Visit: Payer: Self-pay | Admitting: Internal Medicine

## 2021-01-16 DIAGNOSIS — M3500 Sicca syndrome, unspecified: Secondary | ICD-10-CM

## 2021-01-18 NOTE — Telephone Encounter (Signed)
Okay to continue refills for HCQ, I do recommend f/u in about 6 mos from last visit (10/21/20) since we changed medication dosing.

## 2021-02-01 DIAGNOSIS — N185 Chronic kidney disease, stage 5: Secondary | ICD-10-CM | POA: Diagnosis not present

## 2021-02-10 DIAGNOSIS — N2581 Secondary hyperparathyroidism of renal origin: Secondary | ICD-10-CM | POA: Diagnosis not present

## 2021-02-10 DIAGNOSIS — N185 Chronic kidney disease, stage 5: Secondary | ICD-10-CM | POA: Diagnosis not present

## 2021-02-10 DIAGNOSIS — I10 Essential (primary) hypertension: Secondary | ICD-10-CM | POA: Diagnosis not present

## 2021-02-10 DIAGNOSIS — D631 Anemia in chronic kidney disease: Secondary | ICD-10-CM | POA: Diagnosis not present

## 2021-04-05 ENCOUNTER — Ambulatory Visit: Payer: BC Managed Care – PPO | Admitting: Family Medicine

## 2021-04-21 ENCOUNTER — Ambulatory Visit: Payer: BC Managed Care – PPO | Admitting: Internal Medicine

## 2021-04-21 ENCOUNTER — Encounter: Payer: Self-pay | Admitting: Internal Medicine

## 2021-04-21 ENCOUNTER — Other Ambulatory Visit: Payer: Self-pay

## 2021-04-21 VITALS — BP 113/70 | HR 87 | Resp 15 | Ht 64.0 in | Wt 118.0 lb

## 2021-04-21 DIAGNOSIS — M3501 Sicca syndrome with keratoconjunctivitis: Secondary | ICD-10-CM | POA: Diagnosis not present

## 2021-04-21 DIAGNOSIS — M797 Fibromyalgia: Secondary | ICD-10-CM

## 2021-04-21 NOTE — Progress Notes (Signed)
Office Visit Note  Patient: Katherine Contreras             Date of Birth: [birth year]           MRN: [id number]             PCP: Luetta Nutting, DO Referring: Luetta Nutting, DO Visit Date: 04/21/2021   Subjective:  Follow-up (Improving, PLQ helping,  more energy and less pain)   History of Present Illness: Katherine Contreras is a 56 y.o. female here for follow up Sjogren's syndrome with arthralgias and keratoconjunctivitis on HCQ 200 mg 4 days weekly and cevimeline 2 times per day. She notices mouth dryness is worse later in the day she feels like the evoxac medication is possibly wearing off between doses. A tooth implant on the left side failed and had to b removed. Joint pains are doing pretty well overall having some stiffness and catching of her fingers in the mornings and numbness in hands sometimes lasting half an hour. She doesn't have a lot of swelling. She had additional transplant workup in September with no problems on nuclear medicine heart scan.  Previous HPI 10/21/20 Katherine Contreras is a 56 y.o. female here for follow up for sjogren's syndrome with arthralgias and keratoconjunctivitis sicca symptoms after starting hydroxychloroquine about 2 months ago. She feels her symptoms are overall somewhat improved. Her knee pain is decreased and has not experienced any flare or episodes of swelling. She thinks eye and mouth dryness may be slightly improved although she has not been singing recently which was the most problematic activity for this. She has left shoulder pain ongoing since at least several weeks ago or possibly a couple months that bothers her intermittently mostly with use. She initially attributed this to her fibromyalgia but it did not improve as quickly as these symptoms usually do. Her renal function numbers were worse with eGFR back below 15 so is discussing plans again with nephrology.  Previous HPI:  08/21/20 Katherine Contreras is a 56 y.o. female here for evaluation of sjogren's  syndrome and fibromyalgia.  She states original diagnosis of Sjogren's syndrome was around 1985 with rheumatology at the time she mostly recalls the fibromyalgia symptoms as predominant but over subsequent years has noticed worsening symptoms with chronic dry eyes and dry mouth.  She has been treated with cevimeline with good improvement in her oral dryness.  She never developed any particularly inflammatory joint changes however chronic fibromyalgia pain is also worsened with some osteoarthritis especially in the right knee.  She has been treated with multiple drugs including Elavil, Lyrica, Cymbalta, and currently takes [facility] which he feels like is helpful but no longer fully controlling her symptoms.  She was recently tried with addition of Lyrica but made her feel like a drunk sensation.  She works with a Clinical research associate and exercising in the pool which have been very beneficial for her symptoms. Current problems are primarily the sicca syndrome, chronic fatigue, chronic constipation, plus or diffuse joint pain and body aches.  She has advanced chronic kidney disease now estimated as stage IV thought to be contributed largely to chronic NSAID use and sees Dr. Graylon Gunning at [facility] for this.  She reports she has been on a kidney transplant list for about 4 years.   Review of Systems  Constitutional:  Negative for fatigue.  HENT:  Positive for mouth dryness.   Eyes:  Positive for dryness.  Respiratory:  Negative for shortness of breath.  Cardiovascular:  Negative for swelling in legs/feet.  Gastrointestinal:  Positive for constipation and diarrhea.  Endocrine: Negative for excessive thirst.  Genitourinary:  Negative for difficulty urinating.  Musculoskeletal:  Positive for joint pain, joint pain, muscle weakness, morning stiffness and muscle tenderness.  Skin:  Negative for rash.  Allergic/Immunologic: Negative for susceptible to infections.  Neurological:  Positive for numbness.   Hematological:  Positive for bruising/bleeding tendency.  Psychiatric/Behavioral:  Negative for sleep disturbance.    PMFS History:  Patient Active Problem List   Diagnosis Date Noted   Pain in left shoulder 10/21/2020   Vitamin D deficiency 08/21/2020   Sjogren's syndrome ([place]) 07/17/2020   Personal history of diseases of the blood and blood-forming organs and certain disorders involving the immune mechanism 07/17/2020   Fibromyalgia 07/17/2020   Chronic kidney disease, stage 4 (severe) ([place]) 07/17/2020   Chronic constipation 07/17/2020   Anxiety disorder 07/17/2020   Amnesia 07/17/2020   Allergic rhinitis 07/17/2020    Past Medical History:  Diagnosis Date   Anxiety    Blood dyscrasia    remission of ITP   Chronic kidney disease    THIRD STAGE KIDNEY DISEASE - 30%   Fibromyalgia    Sjogren's syndrome (HCC)    TMJ click    TMJ syndrome     Family History  Problem Relation Age of Onset   Hypertension Mother    Heart attack Mother    Bone cancer Father    Multiple myeloma Father    Kidney disease Sister    Past Surgical History:  Procedure Laterality Date   APPENDECTOMY     BREAST SURGERY     AUGMENTATION   HYSTEROSCOPY WITH D & C  03/28/2012   Procedure: DILATATION AND CURETTAGE /HYSTEROSCOPY;  Surgeon: Thurnell Lose, MD;  Location: [facility];  Service: Gynecology;  Laterality: N/A;   SPLEENECTOMY  12/05/83   had both (yes, she had two) spleens removed this date   TONSILLECTOMY     Social History   Social History Narrative   Not on file   Immunization History  Administered Date(s) Administered   Influenza Split 12/29/2008, 01/26/2011, 12/28/2011, 01/01/2013, 10/21/2015, 01/05/2017   Influenza,inj,Quad PF,6+ Mos 12/31/2015, 01/17/2019   Influenza,inj,quad, With Preservative 01/28/2014, 12/30/2014   Td 09/02/2003   Tdap 12/28/2011     Objective: Vital Signs: BP 113/70 (BP Location: Left Arm, Patient Position: Sitting, Cuff Size: Normal)   Pulse 87   Resp  15   Ht $R'5\' 4"'Io$  (1.626 m)   Wt 118 lb (53.5 kg)   BMI 20.25 kg/m    Physical Exam HENT:     Mouth/Throat:     Mouth: Mucous membranes are dry.     Pharynx: Oropharynx is clear.  Eyes:     Conjunctiva/sclera: Conjunctivae normal.  Cardiovascular:     Rate and Rhythm: Normal rate and regular rhythm.  Pulmonary:     Effort: Pulmonary effort is normal.     Breath sounds: Normal breath sounds.  Skin:    General: Skin is warm and dry.     Findings: No rash.  Neurological:     General: No focal deficit present.     Mental Status: She is alert.  Psychiatric:        Mood and Affect: Mood normal.     Musculoskeletal Exam:  Shoulders full ROM no tenderness or swelling Elbows full ROM no tenderness or swelling Wrists full ROM no tenderness or swelling Fingers full ROM no tenderness or swelling, right 3rd finger  flexor tendon tight on palm with restricted extension ROM no palpable nodule Knees full ROM no tenderness or swelling, patellofemoral crepitus present Ankles full ROM no tenderness or swelling   Investigation: No additional findings.  Imaging: No results found.  Recent Labs: Lab Results  Component Value Date   WBC 8.8 08/26/2020   HGB 8.9 (A) 08/26/2020   PLT 343 08/26/2020   NA 140 08/26/2020   K 3.5 08/26/2020   CL 111 (A) 08/26/2020   CO2 18 08/26/2020   GLUCOSE 155 (H) 05/27/2016   BUN 40 (A) 08/26/2020   CREATININE 4.1 (A) 08/26/2020   BILITOT 0.5 05/27/2016   ALKPHOS 100 05/27/2016   AST 30 05/27/2016   ALT 29 05/27/2016   PROT 7.7 05/27/2016   ALBUMIN 4.3 08/26/2020   CALCIUM 9.2 08/26/2020   CALCIUM 9.2 08/26/2020   GFRAA 26 (L) 05/27/2016    Speciality Comments: No specialty comments available.  Procedures:  No procedures performed Allergies: Aspirin, Gabapentin, and Lyrica [pregabalin]   Assessment / Plan:     Visit Diagnoses: Sjogren's syndrome with keratoconjunctivitis sicca ([place])  Overall doing very well feels symptoms have improved  somewhat with continued hydroxychloroquine treatment.  She has had some trouble with loss of a dental implant and continued mouth dryness particularly worst at the evenings.  I recommend she can try increasing the cevimeline to 3 times daily as needed instead of twice to see if this gives a more consistent improvement.  Cautioned for side effect monitoring such as tremulousness excessive sweating.  We will continue the hydroxychloroquine 200 mg 4 days weekly. Most recent eye exam without any problematic findings. She has upcoming labs monitoring planned with nephrology and then primary care provider the week after her only recent medication change was a dose decrease.  Fibromyalgia  Currently doing well no severe generalized pain or sensitivity at this time.  She has some mild symptoms involving the right hand looks consistent with mild trigger finger or possibly very early palmar fascial fibromatosis recommended stretching this regularly for now as initial treatment. Some numbness in the hands but not typical of carpal tunnel syndrome or affecting activities much.  Orders: No orders of the defined types were placed in this encounter.  No orders of the defined types were placed in this encounter.    Follow-Up Instructions: Return in about 6 months (around 10/19/2021) for pSS on HCQ/cevimellin f/u 73mo.   CCollier Salina MD  Note - This record has been created using [employer]  Chart creation errors have been sought, but may not always  have been located. Such creation errors do not reflect on  the standard of medical care.

## 2021-05-04 ENCOUNTER — Other Ambulatory Visit: Payer: Self-pay

## 2021-05-04 ENCOUNTER — Ambulatory Visit (INDEPENDENT_AMBULATORY_CARE_PROVIDER_SITE_OTHER): Payer: BC Managed Care – PPO | Admitting: Family Medicine

## 2021-05-04 ENCOUNTER — Encounter: Payer: Self-pay | Admitting: Family Medicine

## 2021-05-04 VITALS — BP 109/73 | HR 92 | Temp 97.9°F | Ht 64.0 in | Wt 116.1 lb

## 2021-05-04 DIAGNOSIS — Z23 Encounter for immunization: Secondary | ICD-10-CM

## 2021-05-04 DIAGNOSIS — Q8901 Asplenia (congenital): Secondary | ICD-10-CM | POA: Diagnosis not present

## 2021-05-04 DIAGNOSIS — M3501 Sicca syndrome with keratoconjunctivitis: Secondary | ICD-10-CM

## 2021-05-04 DIAGNOSIS — Z1211 Encounter for screening for malignant neoplasm of colon: Secondary | ICD-10-CM

## 2021-05-04 DIAGNOSIS — N184 Chronic kidney disease, stage 4 (severe): Secondary | ICD-10-CM

## 2021-05-04 DIAGNOSIS — F411 Generalized anxiety disorder: Secondary | ICD-10-CM

## 2021-05-04 DIAGNOSIS — N185 Chronic kidney disease, stage 5: Secondary | ICD-10-CM | POA: Diagnosis not present

## 2021-05-04 NOTE — Assessment & Plan Note (Signed)
Bexsero and Menveo given today.

## 2021-05-04 NOTE — Progress Notes (Signed)
Katherine Contreras - 56 y.o. female MRN [id number]  Date of birth: [birth year]  Subjective Chief Complaint  Patient presents with   Anxiety    HPI Katherine Contreras is a 56 year old female here today for follow-up of anxiety.  She reports she is doing remarkably well at this time.  She is seeing rheumatology regularly for management of Sjogren's and has had significant improvement of her pain with addition of Plaquenil.  She does continue on Savella for management of fibromyalgia symptoms.  She reports she is no longer needing alprazolam for her anxiety as much of her anxiety was related to her chronic pain.  She does continue to see nephrology for management of her CKD.  She is undergoing evaluation for potential transplant.  She does have history of asplenia and needs updated vaccines for meningitis.  ROS:  A comprehensive ROS was completed and negative except as noted per HPI  Allergies  Allergen Reactions   Aspirin Other (See Comments)    SPLEEN REMOVED AND HISTORY OF PLATELET PROBLEM IN THE PAST Pt takes alleve if needed   Gabapentin Nausea Only and Other (See Comments)    Dizziness, Syncope   Lyrica [Pregabalin]     Past Medical History:  Diagnosis Date   Anxiety    Blood dyscrasia    remission of ITP   Chronic kidney disease    THIRD STAGE KIDNEY DISEASE - 30%   Fibromyalgia    Sjogren's syndrome (HCC)    TMJ click    TMJ syndrome     Past Surgical History:  Procedure Laterality Date   APPENDECTOMY     BREAST SURGERY     AUGMENTATION   HYSTEROSCOPY WITH D & C  03/28/2012   Procedure: DILATATION AND CURETTAGE /HYSTEROSCOPY;  Surgeon: Thurnell Lose, MD;  Location: [facility];  Service: Gynecology;  Laterality: N/A;   SPLEENECTOMY  12/05/83   had both (yes, she had two) spleens removed this date   TONSILLECTOMY      Social History   Socioeconomic History   Marital status: Divorced    Spouse name: Not on file   Number of children: Not on file   Years of education: Not on file    Highest education level: Not on file  Occupational History   Occupation: Musician  Tobacco Use   Smoking status: Never   Smokeless tobacco: Never  Vaping Use   Vaping Use: Never used  Substance and Sexual Activity   Alcohol use: Yes    Comment: Occasionally   Drug use: No   Sexual activity: Yes    Partners: Male    Birth control/protection: Post-menopausal  Other Topics Concern   Not on file  Social History Narrative   Not on file   Social Determinants of Health   Financial Resource Strain: Not on file  Food Insecurity: Not on file  Transportation Needs: Not on file  Physical Activity: Not on file  Stress: Not on file  Social Connections: Not on file    Family History  Problem Relation Age of Onset   Hypertension Mother    Heart attack Mother    Bone cancer Father    Multiple myeloma Father    Kidney disease Sister     Health Maintenance  Topic Date Due   COVID-19 Vaccine (1) Never done   Meningococcal B Vaccine (1 of 4 - Increased Risk Bexsero 2-dose series) Never done   HIV Screening  Never done   Hepatitis C Screening  Never done   Zoster  Vaccines- Shingrix (1 of 2) Never done   COLONOSCOPY (Pts 45-57yr Insurance coverage will need to be confirmed)  Never done   INFLUENZA VACCINE  11/16/2020   Pneumococcal Vaccine [id number] old (2 - PPSV23 if available, else PCV20) 12/10/2021   MAMMOGRAM  12/15/2021   PAP SMEAR-Modifier  10/13/2023   TETANUS/TDAP  12/11/2030   HPV VACCINES  Aged Out     ----------------------------------------------------------------------------------------------------------------------------------------------------------------------------------------------------------------- Physical Exam BP 109/73 (BP Location: Left Arm, Patient Position: Sitting, Cuff Size: Normal)    Pulse 92    Temp 97.9 F (36.6 C) (Oral)    Ht 5' 4"  (1.626 m)    Wt 116 lb 1.9 oz (52.7 kg)    SpO2 100%    BMI 19.93 kg/m   Physical Exam Constitutional:       Appearance: Normal appearance.  Eyes:     General: No scleral icterus. Cardiovascular:     Rate and Rhythm: Normal rate and regular rhythm.  Pulmonary:     Effort: Pulmonary effort is normal.     Breath sounds: Normal breath sounds.  Musculoskeletal:     Cervical back: Neck supple.  Neurological:     General: No focal deficit present.     Mental Status: She is alert.  Psychiatric:        Mood and Affect: Mood normal.        Behavior: Behavior normal.    ------------------------------------------------------------------------------------------------------------------------------------------------------------------------------------------------------------------- Assessment and Plan  Chronic kidney disease, stage 4 (severe) ([facility] per nephrology.  She is undergoing evaluation for transplant.  Sjogren's syndrome ([facility] per rheumatology.  Stable at this time with current dose of Plaquenil.  Asplenia Bexsero and Menveo given today.   Anxiety disorder She has not really used alprazolam in several months.  She does not need refill at this time.   No orders of the defined types were placed in this encounter.   No follow-ups on file.    This visit occurred during the SARS-CoV-2 public health emergency.  Safety protocols were in place, including screening questions prior to the visit, additional usage of staff PPE, and extensive cleaning of exam room while observing appropriate contact time as indicated for disinfecting solutions.

## 2021-05-04 NOTE — Assessment & Plan Note (Signed)
Management per rheumatology.  Stable at this time with current dose of Plaquenil.

## 2021-05-04 NOTE — Assessment & Plan Note (Signed)
Management per nephrology.  She is undergoing evaluation for transplant.

## 2021-05-04 NOTE — Assessment & Plan Note (Signed)
She has not really used alprazolam in several months.  She does not need refill at this time.

## 2021-05-12 DIAGNOSIS — I10 Essential (primary) hypertension: Secondary | ICD-10-CM | POA: Diagnosis not present

## 2021-05-12 DIAGNOSIS — N2581 Secondary hyperparathyroidism of renal origin: Secondary | ICD-10-CM | POA: Diagnosis not present

## 2021-05-12 DIAGNOSIS — D631 Anemia in chronic kidney disease: Secondary | ICD-10-CM | POA: Diagnosis not present

## 2021-05-12 DIAGNOSIS — N185 Chronic kidney disease, stage 5: Secondary | ICD-10-CM | POA: Diagnosis not present

## 2021-06-15 ENCOUNTER — Other Ambulatory Visit: Payer: Self-pay | Admitting: Family Medicine

## 2021-07-10 ENCOUNTER — Emergency Department
Admission: EM | Admit: 2021-07-10 | Discharge: 2021-07-10 | Disposition: A | Discharge status: Home / Self Care | Payer: BC Managed Care – PPO | Source: Home / Self Care

## 2021-07-10 ENCOUNTER — Other Ambulatory Visit: Payer: Self-pay

## 2021-07-10 DIAGNOSIS — S41111A Laceration without foreign body of right upper arm, initial encounter: Secondary | ICD-10-CM | POA: Diagnosis not present

## 2021-07-10 DIAGNOSIS — S61401A Unspecified open wound of right hand, initial encounter: Secondary | ICD-10-CM | POA: Diagnosis not present

## 2021-07-10 DIAGNOSIS — W540XXA Bitten by dog, initial encounter: Secondary | ICD-10-CM | POA: Diagnosis not present

## 2021-07-10 MED ORDER — AMOXICILLIN-POT CLAVULANATE 875-125 MG PO TABS: 1.0000 | ORAL_TABLET | Freq: Two times a day (BID) | ORAL | 0 refills | Status: AC

## 2021-07-10 NOTE — ED Provider Notes (Signed)
?[facility] ? ? ? ?CSN: [id number] ?Arrival date & time: 07/10/21  1023 ? ? ?  ? ?History   ?Chief Complaint ?Chief Complaint  ?Patient presents with  ? Animal Bite  ?  Dog bite right hand. X1 day  ? ? ?HPI ?Katherine Contreras is a 56 y.o. female.  ? ?HPI 56 year old female presents with dog bite of right hand that occurred earlier this morning.  PMH significant for CKD stage IV and fibromyalgia. ? ?Past Medical History:  ?Diagnosis Date  ? Anxiety   ? Blood dyscrasia   ? remission of ITP  ? Chronic kidney disease   ? THIRD STAGE KIDNEY DISEASE - 30%  ? Fibromyalgia   ? Sjogren's syndrome ([place])   ? TMJ click   ? TMJ syndrome   ? ? ?Patient Active Problem List  ? Diagnosis Date Noted  ? Asplenia 12/09/2020  ? Pain in left shoulder 10/21/2020  ? Vitamin D deficiency 08/21/2020  ? Sjogren's syndrome ([place]) 07/17/2020  ? Personal history of diseases of the blood and blood-forming organs and certain disorders involving the immune mechanism 07/17/2020  ? Fibromyalgia 07/17/2020  ? Chronic kidney disease, stage 4 (severe) ([place]) 07/17/2020  ? Chronic constipation 07/17/2020  ? Anxiety disorder 07/17/2020  ? Amnesia 07/17/2020  ? Allergic rhinitis 07/17/2020  ? ? ?Past Surgical History:  ?Procedure Laterality Date  ? APPENDECTOMY    ? BREAST SURGERY    ? AUGMENTATION  ? HYSTEROSCOPY WITH D & C  03/28/2012  ? Procedure: DILATATION AND CURETTAGE /HYSTEROSCOPY;  Surgeon: Thurnell Lose, MD;  Location: [facility];  Service: Gynecology;  Laterality: N/A;  ? SPLEENECTOMY  12/05/83  ? had both (yes, she had two) spleens removed this date  ? TONSILLECTOMY    ? ? ?OB History   ? ? Gravida  ?1  ? Para  ?1  ? Term  ?   ? Preterm  ?   ? AB  ?   ? Living  ?   ?  ? ? SAB  ?   ? IAB  ?   ? Ectopic  ?   ? Multiple  ?   ? Live Births  ?1  ?   ?  ?  ? ? ? ?Home Medications   ? ?Prior to Admission medications   ?Medication Sig Start Date End Date Taking? Authorizing Provider  ?ALPRAZolam (XANAX) 0.5 MG tablet Take 0.25-0.5 mg by mouth 3  (three) times daily as needed. 07/01/20  Yes [provider]  ?amoxicillin-clavulanate (AUGMENTIN) 875-125 MG tablet Take 1 tablet by mouth 2 (two) times daily for 10 days. 07/10/21 07/20/21 Yes Eliezer Lofts, FNP  ?Apoaequorin (PREVAGEN PO) Take by mouth.   Yes [provider]  ?b complex vitamins capsule Take 1 capsule by mouth daily.   Yes [provider]  ?cevimeline (EVOXAC) 30 MG capsule Take 30 mg by mouth 3 (three) times daily.   Yes [provider]  ?Cholecalciferol 25 MCG (1000 UT) tablet Take by mouth.   Yes [provider]  ?cyclobenzaprine (FLEXERIL) 10 MG tablet Take 10 mg by mouth 2 (two) times daily. For muscle spasms; TMJ   Yes [provider]  ?hydroxychloroquine (PLAQUENIL) 200 MG tablet TAKE 1 TABLET BY MOUTH ONCE DAILY ON MONDAY-FRIDAY 01/18/21  Yes Rice, Resa Miner, MD  ?Milnacipran HCl (SAVELLA) 100 MG TABS tablet Take 100 mg by mouth 2 (two) times daily.   Yes [provider]  ?Multiple Vitamin (MULTIVITAMIN ADULT PO) Take 1  tablet by mouth daily.   Yes [provider]  ?Multiple Vitamins-Minerals (HAIR SKIN AND NAILS FORMULA) TABS    Yes [provider]  ?sodium bicarbonate 650 MG tablet Take 650 mg by mouth 2 (two) times daily. 10/07/20  Yes [provider]  ?traMADol (ULTRAM) 50 MG tablet 1 tablet 10/15/12  Yes [provider]  ?VITAMIN D PO Take by mouth.   Yes [provider]  ?estradiol (ESTRACE) 0.1 MG/GM vaginal cream Inset 1 inch nightly for 14 days then twice a week ?Patient not taking: Reported on 04/21/2021 10/12/20   Guss Bunde, MD  ? ? ?Family History ?Family History  ?Problem Relation Age of Onset  ? Hypertension Mother   ? Heart attack Mother   ? Bone cancer Father   ? Multiple myeloma Father   ? Kidney disease Sister   ? ? ?Social History ?Social History  ? ?Tobacco Use  ? Smoking status: Never  ? Smokeless tobacco: Never  ?Vaping Use  ? Vaping Use: Never used   ?Substance Use Topics  ? Alcohol use: Yes  ?  Comment: Occasionally  ? Drug use: No  ? ? ? ?Allergies   ?Aspirin, Gabapentin, and Lyrica [pregabalin] ? ? ?Review of Systems ?Review of Systems  ?Skin:  Positive for wound.  ?Neurological:  Positive for headaches.  ?All other systems reviewed and are negative. ? ? ?Physical Exam ?Triage Vital Signs ?ED Triage Vitals  ?Enc Vitals Group  ?   BP 07/10/21 1046 105/69  ?   Pulse Rate 07/10/21 1046 89  ?   Resp 07/10/21 1046 18  ?   Temp 07/10/21 1046 98.3 ?F (36.8 ?C)  ?   Temp Source 07/10/21 1046 Oral  ?   SpO2 07/10/21 1046 98 %  ?   Weight 07/10/21 1042 120 lb (54.4 kg)  ?   Height 07/10/21 1042 5' 5.5" (1.664 m)  ?   Head Circumference --   ?   Peak Flow --   ?   Pain Score 07/10/21 1042 7  ?   Pain Loc --   ?   Pain Edu? --   ?   Excl. in [place]? --   ? ?No data found. ? ?Updated Vital Signs ?BP 105/69 (BP Location: Left Arm)   Pulse 89   Temp 98.3 ?F (36.8 ?C) (Oral)   Resp 18   Ht 5' 5.5" (1.664 m)   Wt 120 lb (54.4 kg)   SpO2 98%   BMI 19.67 kg/m?  ? ?  ? ?Physical Exam ?Vitals and nursing note reviewed.  ?Constitutional:   ?   General: She is not in acute distress. ?   Appearance: Normal appearance. She is normal weight. She is not ill-appearing.  ?HENT:  ?   Head: Normocephalic and atraumatic.  ?   Mouth/Throat:  ?   Mouth: Mucous membranes are moist.  ?   Pharynx: Oropharynx is clear.  ?Eyes:  ?   Extraocular Movements: Extraocular movements intact.  ?   Conjunctiva/sclera: Conjunctivae normal.  ?   Pupils: Pupils are equal, round, and reactive to light.  ?Cardiovascular:  ?   Rate and Rhythm: Normal rate and regular rhythm.  ?   Pulses: Normal pulses.  ?   Heart sounds: Normal heart sounds.  ?Pulmonary:  ?   Effort: Pulmonary effort is normal.  ?   Breath sounds: Normal breath sounds. No wheezing, rhonchi or rales.  ?Musculoskeletal:  ?   Cervical back: Normal range of  motion and neck supple.  ?Skin: ?   General: Skin is warm and dry.  ?   Comments: Right  lower arm (volar aspect): ~1.0 cm x 1.0 cm x 1.0 cm triangle shaped laceration/bite wound with 2 mm depth, wound explored no foreign bodies noted; benzoin tincture used to anchor, skin edges everted with 2 Steri-Strips placed, without complication. ?Right hand (dorsum over mid first metacarpal): 1.5 cm x 0.5 cm x 2 mm laceration/bite wound, wound explored no foreign bodies noted; benzoin tincture used to anchor, skin edges everted with 4 Steri-Strips placed, without complication  ?Neurological:  ?   General: No focal deficit present.  ?   Mental Status: She is alert and oriented to person, place, and time. Mental status is at baseline.  ? ? ? ?UC Treatments / Results  ?Labs ?(all labs ordered are listed, but only abnormal results are displayed) ?Labs Reviewed - No data to display ? ?EKG ? ? ?Radiology ?No results found. ? ?Procedures ?Procedures (including critical care time) ? ?Medications Ordered in UC ?Medications - No data to display ? ?Initial Impression / Assessment and Plan / UC Course  ?I have reviewed the triage vital signs and the nursing notes. ? ?Pertinent labs & imaging results that were available during my care of the patient were reviewed by me and considered in my medical decision making (see chart for details). ? ?  ? ?MDM: 1.  Dog bite, initial encounter Rx'd Augmentin; 2.  Avulsion of skin of right hand, initial encounter-repaired with Steri-Strips; 3.  Laceration of arm right, initial encounter-repaired with Steri-Strips. Advised patient to take medication as directed with food to completion.  Encouraged patient to increase daily water intake while taking this medication.  Advised patient if signs or symptoms of infection occur please follow-up immediately for further evaluation.  Patient discharged home, hemodynamically stable.  ? ?Final Clinical Impressions(s) / UC Diagnoses  ? ?Final diagnoses:  ?Dog bite, initial encounter  ?Avulsion of skin of right hand, initial encounter  ?Laceration of  arm, right, initial encounter  ? ? ? ?Discharge Instructions   ? ?  ?Advised patient to take medication as directed with food to completion.  Encouraged patient to increase daily water intake while taking this medication.  Ad

## 2021-07-10 NOTE — Discharge Instructions (Addendum)
Advised patient to take medication as directed with food to completion.  Encouraged patient to increase daily water intake while taking this medication.  Advised patient if signs or symptoms of infection occur please follow-up immediately for further evaluation. ?

## 2021-07-10 NOTE — ED Triage Notes (Signed)
Pt states that she was bit by dog on her right hand.  ?

## 2021-08-02 ENCOUNTER — Ambulatory Visit (INDEPENDENT_AMBULATORY_CARE_PROVIDER_SITE_OTHER): Payer: BC Managed Care – PPO | Admitting: Family Medicine

## 2021-08-02 ENCOUNTER — Encounter: Payer: Self-pay | Admitting: Family Medicine

## 2021-08-02 ENCOUNTER — Other Ambulatory Visit: Payer: Self-pay

## 2021-08-02 VITALS — BP 120/81 | HR 70 | Ht 65.5 in | Wt 118.0 lb

## 2021-08-02 DIAGNOSIS — N951 Menopausal and female climacteric states: Secondary | ICD-10-CM | POA: Diagnosis not present

## 2021-08-02 DIAGNOSIS — M25552 Pain in left hip: Secondary | ICD-10-CM | POA: Diagnosis not present

## 2021-08-02 MED ORDER — NORETHINDRONE-ETH ESTRADIOL 0.5-2.5 MG-MCG PO TABS: 1.0000 | ORAL_TABLET | Freq: Every day | ORAL | 3 refills | Status: DC

## 2021-08-02 MED ORDER — PREDNISONE 50 MG PO TABS: ORAL_TABLET | ORAL | 0 refills | Status: DC

## 2021-08-02 NOTE — Patient Instructions (Signed)
Try estrogen/progesterone combo.  Discontinue topical estrogen.  ?If hip pain worsens start prednisone and make appt with Dr. Dianah Field.  ?

## 2021-08-02 NOTE — Progress Notes (Signed)
?Katherine Contreras - 56 y.o. female MRN [id number]  Date of birth: 10-13-1965 ? ?Subjective ?Chief Complaint  ?Patient presents with  ? Vaginal Pain  ? ? ?HPI ?Katherine Contreras is a 56 y.o. female here today with complaint of left hip pain and vaginal dryness.   ? ?She has had issues with her hip in the past.  History of what sounds like a labral cyst that she had aspirated at [place] x2.   Pain worsened last week but is better currently.  She denies any catching sensation or weakness of the hip.  She denies back pain.   ? ?She is currently using topical estrogen for post menopausal vaginal dryness and atrophy.  She doesn't feel like this works very well and it is too messy for her.  She would like to try oral HRT.  She does not have history of DVT and she is a non-smoker.   ? ?ROS:  A comprehensive ROS was completed and negative except as noted per HPI ? ?Allergies  ?Allergen Reactions  ? Aspirin Other (See Comments)  ?  SPLEEN REMOVED AND HISTORY OF PLATELET PROBLEM IN THE PAST ?Pt takes alleve if needed  ? Gabapentin Nausea Only and Other (See Comments)  ?  Dizziness, Syncope  ? Lyrica [Pregabalin]   ? ? ?Past Medical History:  ?Diagnosis Date  ? Anxiety   ? Blood dyscrasia   ? remission of ITP  ? Chronic kidney disease   ? THIRD STAGE KIDNEY DISEASE - 30%  ? Fibromyalgia   ? Sjogren's syndrome ([place])   ? TMJ click   ? TMJ syndrome   ? ? ?Past Surgical History:  ?Procedure Laterality Date  ? APPENDECTOMY    ? BREAST SURGERY    ? AUGMENTATION  ? HYSTEROSCOPY WITH D & C  03/28/2012  ? Procedure: DILATATION AND CURETTAGE /HYSTEROSCOPY;  Surgeon: Thurnell Lose, MD;  Location: [facility];  Service: Gynecology;  Laterality: N/A;  ? SPLEENECTOMY  12/05/83  ? had both (yes, she had two) spleens removed this date  ? TONSILLECTOMY    ? ? ?Social History  ? ?Socioeconomic History  ? Marital status: Divorced  ?  Spouse name: Not on file  ? Number of children: Not on file  ? Years of education: Not on file  ? Highest education  level: Not on file  ?Occupational History  ? Occupation: Musician  ?Tobacco Use  ? Smoking status: Never  ? Smokeless tobacco: Never  ?Vaping Use  ? Vaping Use: Never used  ?Substance and Sexual Activity  ? Alcohol use: Yes  ?  Comment: Occasionally  ? Drug use: No  ? Sexual activity: Yes  ?  Partners: Male  ?  Birth control/protection: Post-menopausal  ?Other Topics Concern  ? Not on file  ?Social History Narrative  ? Not on file  ? ?Social Determinants of Health  ? ?Financial Resource Strain: Not on file  ?Food Insecurity: Not on file  ?Transportation Needs: Not on file  ?Physical Activity: Not on file  ?Stress: Not on file  ?Social Connections: Not on file  ? ? ?Family History  ?Problem Relation Age of Onset  ? Hypertension Mother   ? Heart attack Mother   ? Bone cancer Father   ? Multiple myeloma Father   ? Kidney disease Sister   ? ? ?Health Maintenance  ?Topic Date Due  ? Zoster Vaccines- Shingrix (1 of 2) 08/02/2021 (Originally 06/01/1984)  ? COVID-19 Vaccine (1) 12/17/2021 (Originally 11/29/1965)  ?  COLONOSCOPY (Pts 45-55yr Insurance coverage will need to be confirmed)  08/03/2022 (Originally 06/01/2010)  ? Hepatitis C Screening  08/03/2022 (Originally 06/02/1983)  ? HIV Screening  08/03/2022 (Originally 06/01/1980)  ? INFLUENZA VACCINE  11/16/2021  ? MAMMOGRAM  12/15/2021  ? PAP SMEAR-Modifier  10/13/2023  ? TETANUS/TDAP  12/11/2030  ? HPV VACCINES  Aged Out  ? ? ? ?----------------------------------------------------------------------------------------------------------------------------------------------------------------------------------------------------------------- ?Physical Exam ?BP 120/81 (BP Location: Right Arm, Patient Position: Sitting, Cuff Size: Small)   Pulse 70   Ht 5' 5.5" (1.664 m)   Wt 118 lb (53.5 kg)   SpO2 99%   BMI 19.34 kg/m?  ? ?Physical Exam ?Constitutional:   ?   Appearance: Normal appearance.  ?Eyes:  ?   General: No scleral icterus. ?Cardiovascular:  ?   Rate and Rhythm: Normal  rate and regular rhythm.  ?Pulmonary:  ?   Effort: Pulmonary effort is normal.  ?   Breath sounds: Normal breath sounds.  ?Musculoskeletal:  ?   Cervical back: Neck supple.  ?Neurological:  ?   Mental Status: She is alert.  ?Psychiatric:     ?   Mood and Affect: Mood normal.     ?   Behavior: Behavior normal.  ? ? ?------------------------------------------------------------------------------------------------------------------------------------------------------------------------------------------------------------------- ?Assessment and Plan ? ?Menopausal vaginal dryness ?Discussed changing from topical estrogen to oral estrogen.  Recommend combination estrogen/progesterone as she still has her uterus.  Reviewed potential for increased risk of blood clots with HRT.  She consents to proceed to oral HRT as she feels benefits outweigh risks.  Additionally she may use vaginal lubricants prn.   ? ?Left hip pain ?Xrays of hip ordered.  Adding prednisone to start if hip pain worsens again.  Additionally I recommended scheduling with Dr. TDianah Fieldif this worsens as she may need repeat aspiration of cyst.  ? ? ?No orders of the defined types were placed in this encounter. ? ? ?No follow-ups on file. ? ? ? ?This visit occurred during the SARS-CoV-2 public health emergency.  Safety protocols were in place, including screening questions prior to the visit, additional usage of staff PPE, and extensive cleaning of exam room while observing appropriate contact time as indicated for disinfecting solutions.  ? ?

## 2021-08-02 NOTE — Assessment & Plan Note (Signed)
Xrays of hip ordered.  Adding prednisone to start if hip pain worsens again.  Additionally I recommended scheduling with Dr. Dianah Field if this worsens as she may need repeat aspiration of cyst.  ?

## 2021-08-02 NOTE — Assessment & Plan Note (Signed)
Discussed changing from topical estrogen to oral estrogen.  Recommend combination estrogen/progesterone as she still has her uterus.  Reviewed potential for increased risk of blood clots with HRT.  She consents to proceed to oral HRT as she feels benefits outweigh risks.  Additionally she may use vaginal lubricants prn.   ?

## 2021-08-03 DIAGNOSIS — N185 Chronic kidney disease, stage 5: Secondary | ICD-10-CM | POA: Diagnosis not present

## 2021-08-09 DIAGNOSIS — N184 Chronic kidney disease, stage 4 (severe): Secondary | ICD-10-CM | POA: Diagnosis not present

## 2021-08-09 DIAGNOSIS — N2581 Secondary hyperparathyroidism of renal origin: Secondary | ICD-10-CM | POA: Diagnosis not present

## 2021-08-09 DIAGNOSIS — I10 Essential (primary) hypertension: Secondary | ICD-10-CM | POA: Diagnosis not present

## 2021-08-09 DIAGNOSIS — D631 Anemia in chronic kidney disease: Secondary | ICD-10-CM | POA: Diagnosis not present

## 2021-09-22 ENCOUNTER — Telehealth: Payer: Self-pay

## 2021-09-22 NOTE — Telephone Encounter (Signed)
Pt lvm questing why she would be spotting blood since periods stopped 6 years prior. Requesting answer to the question

## 2021-09-23 NOTE — Telephone Encounter (Signed)
If she is having spotting I would recommend that she follow up with her GYN.

## 2021-09-23 NOTE — Telephone Encounter (Signed)
Please contact the pt and advise that Dr. Zigmund Daniel recommends she sees OB/GYN for spotting concern. Thanks

## 2021-09-23 NOTE — Telephone Encounter (Signed)
V.Mail left for patient regarding concern and recommedation

## 2021-10-10 ENCOUNTER — Other Ambulatory Visit: Payer: Self-pay | Admitting: Family Medicine

## 2021-10-21 ENCOUNTER — Ambulatory Visit: Payer: BC Managed Care – PPO | Admitting: Internal Medicine

## 2021-11-01 ENCOUNTER — Ambulatory Visit (INDEPENDENT_AMBULATORY_CARE_PROVIDER_SITE_OTHER): Payer: BC Managed Care – PPO

## 2021-11-01 ENCOUNTER — Encounter: Payer: Self-pay | Admitting: Family Medicine

## 2021-11-01 ENCOUNTER — Ambulatory Visit (INDEPENDENT_AMBULATORY_CARE_PROVIDER_SITE_OTHER): Payer: BC Managed Care – PPO | Admitting: Family Medicine

## 2021-11-01 VITALS — BP 126/78 | HR 84 | Ht 65.5 in | Wt 117.0 lb

## 2021-11-01 DIAGNOSIS — M3501 Sicca syndrome with keratoconjunctivitis: Secondary | ICD-10-CM | POA: Diagnosis not present

## 2021-11-01 DIAGNOSIS — Z124 Encounter for screening for malignant neoplasm of cervix: Secondary | ICD-10-CM

## 2021-11-01 DIAGNOSIS — N184 Chronic kidney disease, stage 4 (severe): Secondary | ICD-10-CM | POA: Diagnosis not present

## 2021-11-01 DIAGNOSIS — M25552 Pain in left hip: Secondary | ICD-10-CM

## 2021-11-01 DIAGNOSIS — N951 Menopausal and female climacteric states: Secondary | ICD-10-CM

## 2021-11-01 MED ORDER — NORETHINDRONE-ETH ESTRADIOL 0.5-2.5 MG-MCG PO TABS: 1.0000 | ORAL_TABLET | Freq: Every day | ORAL | 3 refills | Status: AC

## 2021-11-01 NOTE — Patient Instructions (Addendum)
Continue current medications.  I have entered a referral to GYN.  Have xray completed.

## 2021-11-01 NOTE — Assessment & Plan Note (Signed)
She has been evaluated for transplant.  Continues to follow with nephrology.

## 2021-11-01 NOTE — Assessment & Plan Note (Signed)
Management per rheumatology.  She has upcoming visit and a couple months.

## 2021-11-01 NOTE — Assessment & Plan Note (Signed)
Postmenopausal symptoms have improved with HRT.  She is having some occasional spotting but this is tolerable.  To be interested in trying something different and will schedule a GYN for updated Pap and further discussion of this.

## 2021-11-01 NOTE — Progress Notes (Signed)
Katherine Contreras - 56 y.o. female MRN [id number]  Date of birth: [birth year]  Subjective Chief Complaint  Patient presents with   Nausea    hip   Hip Pain   Menopause   transplant Prep    HPI Katherine Contreras is a 56 year old female here today for follow-up visit.    She is having some increased left hip pain.  History of what sounds like synovial cyst of the left hip.  She has had this aspirated in the past.  She is supposed to see a rheumatologist however her appointment got pushed back a few months.  She has had some improvement with combination hormone replacement therapy for management of postmenopausal symptoms.  She has had some intermittent nausea.  She also has noticed some spotting around once per month.  She is being evaluated for renal transplant..  Renal function is currently stable.  ROS:  A comprehensive ROS was completed and negative except as noted per HPI  Allergies  Allergen Reactions   Aspirin Other (See Comments)    SPLEEN REMOVED AND HISTORY OF PLATELET PROBLEM IN THE PAST Pt takes alleve if needed   Lyrica [Pregabalin]    Gabapentin Nausea Only and Other (See Comments)    Dizziness, Syncope    Past Medical History:  Diagnosis Date   Anxiety    Blood dyscrasia    remission of ITP   Chronic kidney disease    THIRD STAGE KIDNEY DISEASE - 30%   Fibromyalgia    Sjogren's syndrome ([facility])    TMJ click    TMJ syndrome     Past Surgical History:  Procedure Laterality Date   APPENDECTOMY     BREAST SURGERY     AUGMENTATION   HYSTEROSCOPY WITH D & C  03/28/2012   Procedure: DILATATION AND CURETTAGE /HYSTEROSCOPY;  Surgeon: Thurnell Lose, MD;  Location: [facility];  Service: Gynecology;  Laterality: N/A;   SPLEENECTOMY  12/05/83   had both (yes, she had two) spleens removed this date   TONSILLECTOMY      Social History   Socioeconomic History   Marital status: Divorced    Spouse name: Not on file   Number of children: Not on file   Years of education: Not on file    Highest education level: Not on file  Occupational History   Occupation: Musician  Tobacco Use   Smoking status: Never   Smokeless tobacco: Never  Vaping Use   Vaping Use: Never used  Substance and Sexual Activity   Alcohol use: Yes    Comment: Occasionally   Drug use: No   Sexual activity: Yes    Partners: Male    Birth control/protection: Post-menopausal  Other Topics Concern   Not on file  Social History Narrative   Not on file   Social Determinants of Health   Financial Resource Strain: Not on file  Food Insecurity: Not on file  Transportation Needs: Not on file  Physical Activity: Not on file  Stress: Not on file  Social Connections: Not on file    Family History  Problem Relation Age of Onset   Hypertension Mother    Heart attack Mother    Bone cancer Father    Multiple myeloma Father    Kidney disease Sister     Health Maintenance  Topic Date Due   COVID-19 Vaccine (1) 12/17/2021 (Originally 11/29/1965)   Zoster Vaccines- Shingrix (1 of 2) 02/01/2022 (Originally 06/01/1984)   Meningococcal B Vaccine (1 of 4 - Increased Risk)  02/01/2022 (Originally 06/02/1975)   COLONOSCOPY (Pts 45-63yr Insurance coverage will need to be confirmed)  08/03/2022 (Originally 06/01/2010)   Hepatitis C Screening  08/03/2022 (Originally 06/02/1983)   HIV Screening  08/03/2022 (Originally 06/01/1980)   INFLUENZA VACCINE  11/16/2021   MAMMOGRAM  12/15/2021   PAP SMEAR-Modifier  10/13/2023   TETANUS/TDAP  12/11/2030   HPV VACCINES  Aged Out     ----------------------------------------------------------------------------------------------------------------------------------------------------------------------------------------------------------------- Physical Exam BP 126/78 (BP Location: Left Arm, Patient Position: Sitting, Cuff Size: Small)   Pulse 84   Ht 5' 5.5" (1.664 m)   Wt 117 lb (53.1 kg)   SpO2 100%   BMI 19.17 kg/m   Physical Exam Constitutional:       Appearance: Normal appearance.  Eyes:     General: No scleral icterus. Cardiovascular:     Rate and Rhythm: Normal rate and regular rhythm.  Pulmonary:     Effort: Pulmonary effort is normal.     Breath sounds: Normal breath sounds.  Musculoskeletal:     Cervical back: Neck supple.  Neurological:     Mental Status: She is alert.  Psychiatric:        Mood and Affect: Mood normal.        Behavior: Behavior normal.     ------------------------------------------------------------------------------------------------------------------------------------------------------------------------------------------------------------------- Assessment and Plan  Ch[place] stage 4 (severe[place] [place] She has been evaluated for transplant.  Continues to follow with nephrology.  S[facility]ren's syndrome ([facility] per rheumatology.  She has upcoming visit and a couple months.    Left hip pain Updated x-rays of hip ordered.  Menopausal vaginal dryness Postmenopausal symptoms have improved with HRT.  She is having some occasional spotting but this is tolerable.  To be interested in trying something different and will schedule a GYN for updated Pap and further discussion of this.   Meds ordered this encounter  Medications   norethindrone-ethinyl estradiol (FEMHRT) 0.5-2.5 MG-MCG tablet    Sig: Take 1 tablet by mouth daily.    Dispense:  90 tablet    Refill:  3    No follow-ups on file.    This visit occurred during the SARS-CoV-2 public health emergency.  Safety protocols were in place, including screening questions prior to the visit, additional usage of staff PPE, and extensive cleaning of exam room while observing appropriate contact time as indicated for disinfecting solutions.

## 2021-11-01 NOTE — Assessment & Plan Note (Signed)
Updated x-rays of hip ordered.

## 2021-11-02 ENCOUNTER — Other Ambulatory Visit: Payer: Self-pay

## 2021-11-02 MED ORDER — CYCLOBENZAPRINE HCL 10 MG PO TABS: 10.0000 mg | ORAL_TABLET | Freq: Two times a day (BID) | ORAL | 0 refills | Status: AC

## 2021-11-03 ENCOUNTER — Other Ambulatory Visit: Payer: Self-pay | Admitting: Family Medicine

## 2021-11-03 DIAGNOSIS — M87059 Idiopathic aseptic necrosis of unspecified femur: Secondary | ICD-10-CM

## 2021-11-08 DIAGNOSIS — M25552 Pain in left hip: Secondary | ICD-10-CM | POA: Diagnosis not present

## 2021-11-08 DIAGNOSIS — M7062 Trochanteric bursitis, left hip: Secondary | ICD-10-CM | POA: Diagnosis not present

## 2021-11-16 ENCOUNTER — Telehealth: Payer: Self-pay | Admitting: Internal Medicine

## 2021-11-16 NOTE — Telephone Encounter (Signed)
Patient's daughter called the office stating her mother has passed away.

## 2021-11-16 DEATH — deceased

## 2021-11-29 ENCOUNTER — Ambulatory Visit: Payer: BC Managed Care – PPO | Admitting: Obstetrics & Gynecology

## 2021-12-22 ENCOUNTER — Ambulatory Visit: Payer: BC Managed Care – PPO | Admitting: Internal Medicine
